# Patient Record
Sex: Male | Born: 1960 | Race: White | Hispanic: No | Smoking: Current some day smoker
Health system: Southern US, Community
[De-identification: ages and names within clinical notes are randomized; demographics above are authoritative.]

## PROBLEM LIST (undated history)

## (undated) DIAGNOSIS — E871 Hypo-osmolality and hyponatremia: Secondary | ICD-10-CM

## (undated) DIAGNOSIS — K7290 Hepatic failure, unspecified without coma: Secondary | ICD-10-CM

## (undated) DIAGNOSIS — K7460 Unspecified cirrhosis of liver: Secondary | ICD-10-CM

## (undated) DIAGNOSIS — I8500 Esophageal varices without bleeding: Secondary | ICD-10-CM

## (undated) DIAGNOSIS — R188 Other ascites: Secondary | ICD-10-CM

## (undated) DIAGNOSIS — F1020 Alcohol dependence, uncomplicated: Secondary | ICD-10-CM

## (undated) DIAGNOSIS — D649 Anemia, unspecified: Secondary | ICD-10-CM

## (undated) DIAGNOSIS — K469 Unspecified abdominal hernia without obstruction or gangrene: Secondary | ICD-10-CM

## (undated) DIAGNOSIS — K861 Other chronic pancreatitis: Secondary | ICD-10-CM

## (undated) DIAGNOSIS — K7682 Hepatic encephalopathy: Secondary | ICD-10-CM

## (undated) DIAGNOSIS — J690 Pneumonitis due to inhalation of food and vomit: Secondary | ICD-10-CM

## (undated) DIAGNOSIS — D696 Thrombocytopenia, unspecified: Principal | ICD-10-CM

## (undated) HISTORY — DX: Thrombocytopenia, unspecified: D69.6

---

## 1999-08-12 HISTORY — PX: APPENDECTOMY: SHX54

## 1999-08-12 HISTORY — PX: OTHER SURGICAL HISTORY: SHX169

## 1999-09-07 ENCOUNTER — Inpatient Hospital Stay (HOSPITAL_COMMUNITY): Admission: EM | Admit: 1999-09-07 | Discharge: 1999-09-20 | Payer: Self-pay

## 1999-09-07 ENCOUNTER — Encounter (INDEPENDENT_AMBULATORY_CARE_PROVIDER_SITE_OTHER): Payer: Self-pay | Admitting: Specialist

## 2002-10-11 ENCOUNTER — Inpatient Hospital Stay (HOSPITAL_COMMUNITY): Admission: EM | Admit: 2002-10-11 | Discharge: 2002-10-16 | Payer: Self-pay | Admitting: Emergency Medicine

## 2002-10-11 ENCOUNTER — Encounter: Payer: Self-pay | Admitting: Emergency Medicine

## 2002-10-12 ENCOUNTER — Encounter: Payer: Self-pay | Admitting: Internal Medicine

## 2005-07-28 ENCOUNTER — Emergency Department (HOSPITAL_COMMUNITY): Admission: EM | Admit: 2005-07-28 | Discharge: 2005-07-28 | Payer: Self-pay | Admitting: Emergency Medicine

## 2006-09-26 ENCOUNTER — Encounter: Admission: RE | Admit: 2006-09-26 | Discharge: 2006-09-26 | Payer: Self-pay | Admitting: Endocrinology

## 2009-12-26 ENCOUNTER — Emergency Department (HOSPITAL_BASED_OUTPATIENT_CLINIC_OR_DEPARTMENT_OTHER): Admission: EM | Admit: 2009-12-26 | Discharge: 2009-12-27 | Payer: Self-pay | Admitting: Emergency Medicine

## 2009-12-27 ENCOUNTER — Ambulatory Visit: Payer: Self-pay | Admitting: Diagnostic Radiology

## 2010-05-27 ENCOUNTER — Inpatient Hospital Stay (HOSPITAL_COMMUNITY): Admission: RE | Admit: 2010-05-27 | Discharge: 2010-06-01 | Payer: Self-pay | Admitting: Psychiatry

## 2010-05-27 ENCOUNTER — Emergency Department (HOSPITAL_COMMUNITY): Admission: EM | Admit: 2010-05-27 | Discharge: 2010-05-27 | Payer: Self-pay | Admitting: Emergency Medicine

## 2010-05-27 ENCOUNTER — Ambulatory Visit: Payer: Self-pay | Admitting: Psychiatry

## 2010-06-03 ENCOUNTER — Other Ambulatory Visit (HOSPITAL_COMMUNITY)
Admission: RE | Admit: 2010-06-03 | Discharge: 2010-07-15 | Payer: Self-pay | Source: Home / Self Care | Admitting: Psychiatry

## 2010-07-03 ENCOUNTER — Ambulatory Visit: Payer: Self-pay | Admitting: Psychiatry

## 2010-10-22 LAB — BENZODIAZEPINE, QUANTITATIVE, URINE
Alprazolam (GC/LC/MS), ur confirm: NEGATIVE NG/ML
Flurazepam GC/MS Conf: NEGATIVE NG/ML
Flurazepam GC/MS Conf: NEGATIVE NG/ML
Nordiazepam GC/MS Conf: NEGATIVE NG/ML
Temazepam GC/MS Conf: NEGATIVE NG/ML

## 2010-10-22 LAB — URINE DRUGS OF ABUSE SCREEN W ALC, ROUTINE (REF LAB)
Barbiturate Quant, Ur: NEGATIVE
Benzodiazepines.: POSITIVE — AB
Cocaine Metabolites: NEGATIVE
Creatinine,U: 90.4 mg/dL
Ethyl Alcohol: <10 mg/dL (ref <10)
Marijuana Metabolite: NEGATIVE
Marijuana Metabolite: NEGATIVE
Opiate Screen, Urine: NEGATIVE
Opiate Screen, Urine: NEGATIVE
Phencyclidine (PCP): NEGATIVE
Phencyclidine (PCP): NEGATIVE
Propoxyphene: NEGATIVE

## 2010-10-23 LAB — URINE DRUGS OF ABUSE SCREEN W ALC, ROUTINE (REF LAB)
Barbiturate Quant, Ur: NEGATIVE
Benzodiazepines.: POSITIVE — AB
Cocaine Metabolites: NEGATIVE
Ethyl Alcohol: <10 mg/dL (ref <10)
Marijuana Metabolite: NEGATIVE
Methadone: NEGATIVE
Opiate Screen, Urine: NEGATIVE
Phencyclidine (PCP): NEGATIVE

## 2010-10-23 LAB — URINALYSIS, ROUTINE W REFLEX MICROSCOPIC
Ketones, ur: NEGATIVE mg/dL
Protein, ur: NEGATIVE mg/dL
Urobilinogen, UA: 0.2 mg/dL (ref 0.0–1.0)

## 2010-10-23 LAB — BASIC METABOLIC PANEL
CO2: 27 mEq/L (ref 19–32)
Calcium: 9.2 mg/dL (ref 8.4–10.5)
Creatinine, Ser: 0.77 mg/dL (ref 0.4–1.5)
GFR calc Af Amer: >60 mL/min (ref >60)
GFR calc non Af Amer: >60 mL/min (ref >60)
Glucose, Bld: 99 mg/dL (ref 70–99)
Sodium: 139 mEq/L (ref 135–145)

## 2010-10-23 LAB — DIFFERENTIAL
Basophils Relative: 0 % (ref 0–1)
Lymphs Abs: 1.6 10*3/uL (ref 0.7–4.0)
Neutrophils Relative %: 64 % (ref 43–77)

## 2010-10-23 LAB — BENZODIAZEPINE, QUANTITATIVE, URINE
Nordiazepam GC/MS Conf: NEGATIVE NG/ML
Oxazepam GC/MS Conf: 193 NG/ML — ABNORMAL HIGH

## 2010-10-23 LAB — ETHANOL: Alcohol, Ethyl (B): 318 mg/dL — ABNORMAL HIGH (ref 0–10)

## 2010-10-23 LAB — CBC
HCT: 37.8 % — ABNORMAL LOW (ref 39.0–52.0)
MCHC: 34.8 g/dL (ref 30.0–36.0)
Platelets: 125 10*3/uL — ABNORMAL LOW (ref 150–400)

## 2010-10-23 LAB — RAPID URINE DRUG SCREEN, HOSP PERFORMED
Barbiturates: NOT DETECTED
Benzodiazepines: NOT DETECTED

## 2010-10-23 LAB — HEPATIC FUNCTION PANEL: Total Protein: 7.2 g/dL (ref 6.0–8.3)

## 2010-12-18 ENCOUNTER — Emergency Department (HOSPITAL_COMMUNITY)
Admission: EM | Admit: 2010-12-18 | Discharge: 2010-12-18 | Disposition: A | Discharge status: Home / Self Care | Payer: BC Managed Care – PPO | Attending: Emergency Medicine | Admitting: Emergency Medicine

## 2010-12-18 DIAGNOSIS — R1013 Epigastric pain: Secondary | ICD-10-CM | POA: Insufficient documentation

## 2010-12-18 DIAGNOSIS — R197 Diarrhea, unspecified: Secondary | ICD-10-CM | POA: Insufficient documentation

## 2010-12-18 DIAGNOSIS — R109 Unspecified abdominal pain: Secondary | ICD-10-CM | POA: Insufficient documentation

## 2010-12-18 DIAGNOSIS — K2920 Alcoholic gastritis without bleeding: Secondary | ICD-10-CM | POA: Insufficient documentation

## 2010-12-18 DIAGNOSIS — R112 Nausea with vomiting, unspecified: Secondary | ICD-10-CM | POA: Insufficient documentation

## 2010-12-18 DIAGNOSIS — K2270 Barrett's esophagus without dysplasia: Secondary | ICD-10-CM | POA: Insufficient documentation

## 2010-12-18 DIAGNOSIS — K219 Gastro-esophageal reflux disease without esophagitis: Secondary | ICD-10-CM | POA: Insufficient documentation

## 2010-12-18 LAB — URINALYSIS, ROUTINE W REFLEX MICROSCOPIC
Bilirubin Urine: NEGATIVE
Specific Gravity, Urine: 1.01 (ref 1.005–1.030)
Urobilinogen, UA: 0.2 mg/dL (ref 0.0–1.0)
pH: 7 (ref 5.0–8.0)

## 2010-12-27 NOTE — Discharge Summary (Signed)
NAME:  Alexander Burch, Alexander Burch NO.:  [id number]   MEDICAL RECORD NO.:  [id number]                   PATIENT TYPE:  INP   LOCATION:  0349                                 FACILITY:  [facility]   PHYSICIAN:  Brooke Bonito, M.D.                   DATE OF BIRTH:  03/02/1961   DATE OF ADMISSION:  10/11/2002  DATE OF DISCHARGE:  10/16/2002                                 DISCHARGE SUMMARY   DIAGNOSES:  1. Pancreatitis.  2. Alcohol abuse.   HOSPITAL COURSE:  The patient is a 50 year old male patient of Dr. Marylen Ponto  whom he has not seen for some time, admitted in his absence by Dr. Renne Crigler  after presenting with acute vomiting, epigastric pain, and some chemical  evidence of pancreatitis.  He was placed on pain medications and IV fluids,  and symptomatically improved.  His situation was reviewed with himself and  his mother, mainly that he should never drink any alcohol again because of  possible recurrence of his problem.  Prior to discharge, he is tolerating a  diet without any problem, he is essentially pain-free.  Plan is to be  followed up by Dr. Juleen China on an outpatient basis.    During his hospitalization, the patient was seen by Dr. Anselmo Rod,  gastroenterologist, to review the situation along with reviewing the CAT  scan noting a questionable tight gallstone.  Recommendation was to continue  treatment as outlined with no further intervention or other recommendations  made at this time.   CONDITION ON DISCHARGE:  Improved.   CONSULTATIONS:  Anselmo Rod, M.D., gastroenterology.   LABORATORY DATA:  On admission 10/11/02, CBC showed a white blood cell count  of 13,000, hemoglobin 16.1.  On 10/15/02, white blood cell count was 4300,  hemoglobin 13.4.  Electrolytes on 10/11/02, noted sodium 138, potassium 4.7,  CO2 16, BUN 20, creatinine 1.4, total protein 8.5, glucose 180.  On 10/13/02,  his glucose was 87.  Liver function tests on 10/11/02, AST was 85, ALT 77,  total bilirubin 1.9, magnesium 2, lipase 203, amylase 329.  On 10/15/02,  amylase was 240, and lipase was 137.  Electrocardiogram noted sinus  tachycardia.  Chest x-ray noted no evidence of cardiopulmonary disease.  Abdomen noted mild ileus.  CT scan of the abdomen noted enlarged echogenic  liver, suggestive of early cirrhotic changes, a single gallstone without  evidence for cholecystitis or biliary obstruction was noted, and there were  no obvious change of pancreatitis noted.  CT scan of the pelvis was  unremarkable except for prosthetic calcifications.   CONDITION ON DISCHARGE:  Improved.   FOLLOWUP:  With Dr. Sinclair Ship in one week.   DISCHARGE MEDICATIONS:  1. Multivitamins.  2. Ativan 0.5 mg t.i.d. p.r.n.  3.     Percocet b.i.d. p.r.n.  4. Ambien 10 mg p.r.n. sleep.   DISCHARGE INSTRUCTIONS:  He  needs to abstain from alcohol.                                               Brooke Bonito, M.D.    WDK/MEDQ  D:  11/03/2002  T:  11/03/2002  Job:  [id number]

## 2010-12-27 NOTE — H&P (Signed)
NAME:  Alexander Burch, Alexander Burch NO.:  [id number]   MEDICAL RECORD NO.:  [id number]                   PATIENT TYPE:  EMS   LOCATION:  ED                                   FACILITY:  [facility]   PHYSICIAN:  Soyla Murphy. Renne Crigler, M.D.               DATE OF BIRTH:  1961/06/12   DATE OF ADMISSION:  10/11/2002  DATE OF DISCHARGE:                                HISTORY & PHYSICAL   HISTORY OF PRESENT ILLNESS:  The patient is a 50 year old separated  Caucasian resident of [place] who comes in with a chief complaint of  pain.   His symptoms started about four days ago.  He had intermittent vomiting and  epigastric pain.  Two days ago it got worse and the past 24 hours it has  been persistent.  He states he vomited 25 times in a row.  His throat became  sore and abdominal pain became unbearable so he came to the emergency room.  For the past six months he had been drinking more than usual.  He has four  to five double-strength Congo Mist drinks.  His last ethanol was Sunday  night.  He tried to have something yesterday but he vomited it right back  up.  He has had no bowel movement in two days.  The vomitus has not been  bloody and the last stool had some black flecks in it, he states.  States he  had some chills and fever today.  The pain is described as involving the  entire abdomen, dull, severe, with sharp periods.  He has never had anything  like this before.  No clear exacerbating or remitting factors were noted.   PAST HISTORY:  Chest pain worked up by a [place] physician in [place]  with a stress echo two months ago which was negative by verbal report.  Past  history of complicated appendectomy.   CURRENT MEDICATIONS:  Ibuprofen 200 mg two each morning for arthritis pain.   ALLERGIES AND INTOLERANCES:  None.   PERSONAL HISTORY:  A native of [place].  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  He smokes  three cigarettes a day in the evening.  Alcohol use is as above.  He states  he does not use any other drugs and he denies any concerns about HIV virus.   FAMILY HISTORY:  He has two children alive and well; one is diabetic.  Both  parents are alive and well.   SYSTEMS REVIEW:  No recent weight change.  No change in hearing or vision.  No sinus or ear pain.  He has intermittent headaches.  History of sick  headaches in the past.  No numbness, tingling, weakness, seizures, syncope.  Throat is sore as noted above but no runny nose, no cough.  No chest pain,  shortness of  breath, or change in exercise tolerance.  He states his heart  is beating fast now.  Abdominal pain is as described below with no  constipation or diarrhea, no melena or hematochezia.  No urgency or  frequency but he states that urine burns just a little bit.  A little bit of  joint pain but no red, hot, or swollen joints.  No swelling or skin changes.   PHYSICAL EXAMINATION:  GENERAL:  He is tremulous, no acute distress at rest.  VITAL SIGNS:  Temperature 97.3, pulse 118 and 123, respirations 20, blood  pressure 148/83 and 103/68.  SKIN:  Few nevi.  LYMPH:  There is no cervical, supraclavicular, axillary, or inguinal  adenopathy.  HEENT:  Normocephalic, atraumatic.  PERRL, EOMI.  Mucous membranes are dry.  Posterior pharynx appears normal.  TMs are normal.  NECK:  Supple.  No thyromegaly, no neck masses, no carotid bruits.  PULSES:  Show 2+ and equal carotid, radial, and dorsalis pedis pulses.  LUNGS:  Clear to auscultation and percussion.  HEART:  He has tachycardia, rate about 112, with no murmurs, rubs, nor  gallops.  No JVD, no edema.  ABDOMEN:  Shows diffuse mild tenderness with moderate tenderness in the  epigastric region.  No rebound tenderness.  GENITOURINARY:  Normal external circumcised male genitalia.  RECTAL:  Normal rectal exam with stool brown and heme negative.  No masses  or prostate enlargement or  nodules.  EXTREMITIES:  Within normal limits with no cyanosis, clubbing, or edema.  NEUROLOGIC:  He is alert and oriented x3 with cranial nerves II-XII intact.  Equal strength in both lower extremities.  The ER physician said he was  hyperreflexic.  He did not relax for the exam and I was unable to test his  reflexes well.   IMPRESSION:  1. Acute pancreatitis.  N.p.o., IV fluids, pain control, check abdominal     ultrasound.  Consultation requested with Dr. Virginia Rochester.  2. Ethanol abuse.  Will need the Ativan for withdrawal symptoms - ordered.  3. Elevated glucose, probably secondary to #1.  4. History of complicated appendectomy.  5. History of chest pain two months ago with negative cardiac workup in [place].  6. History of sick headaches.  7. History of black flecks in stool with current stool heme negative.  8. History of slight dysuria.  Will check urinalysis.  9. History of anemia.  Current CBC is normal.  10.      Mildly elevated white count.  11.      Slightly elevated total bilirubin.  Abdominal ultrasound is     pending.                                               Soyla Murphy. Renne Crigler, M.D.    WDP/MEDQ  D:  10/11/2002  T:  10/12/2002  Job:  [id number]   cc:   Brooke Bonito, M.D.  [address]  [place]  [place] [address]  Fax: [phone]   Georgiana Spinner, M.D.  [address]  [place]  [place] [address]  Fax: [phone]

## 2010-12-27 NOTE — Consult Note (Signed)
NAME:  Alexander Burch, Alexander Burch                          ACCOUNT NO.:  [id number]   MEDICAL RECORD NO.:  [id number]                   PATIENT TYPE:  INP   LOCATION:  0349                                 FACILITY:  [facility]   PHYSICIAN:  Anselmo Rod, M.D.               DATE OF BIRTH:  [birth year]   DATE OF CONSULTATION:  10/13/2002  DATE OF DISCHARGE:                                   CONSULTATION   REASON FOR CONSULTATION:  Gallstone on CT scan and pancreatitis.   ASSESSMENT:  1. Acute alcoholic pancreatitis, improving slowly, lipase today is 118, down     from 218 yesterday.  2. Gallstone on CT without evidence of acute cholecystitis or ductal     dilatation.  3. History of chest pain with a recent 2-D echo and a stress test done in     [place] which the patient describes as normal (these results are not     available to me at the present time).  4. History of insomnia and depression.  5. Alcoholism.  6. Occasional gastroesophageal reflux disease.  7. History of complicated appendectomy three years ago.   RECOMMENDATIONS:  1. Hep-Lock IV, start clear liquids, and advance the patient to a low-fat     diet as tolerated.  2. Change Protonix to p.o.  3. Alcohol rehabilitation.  4. Follow LFTs closely.  5. No need for cholecystectomy at this time unless the patient becomes     symptomatic from this standpoint.   DISCUSSION:  Mr. Cohl Behrens is a very pleasant 50 year old white male who  was admitted to [facility] through the emergency room on October 11, 2002, when he presented with severe abdominal pain, nausea, and vomiting.  He was found to have an elevated lipase of 208 on admission and was treated  with IV fluids, IV pain medications, and was kept n.p.o.  His symptoms have  improved tremendously since admission, and he is requesting a meal now.  He  has mild residual periumbilical and epigastric discomfort but no nausea or  vomiting.  He denies history of melena,  hematochezia, ulcers, jaundice, or  colitis.  Appetite is good, and weight has been stable.  There is no history  of change in bowel habits, diarrhea, or constipation.  He averages one to  two bowel movements per day.  There is no history of blood transfusions or  tattoos.  He denies any genitourinary or cardiorespiratory complaints except  for history of chest pain as mentioned above.  He has a history of arthritis  with some discomfort in his hands but denies any vascular, CNS,  dermatologic, or neurologic complaints.  He has a history of depression and  anxiety along with insomnia which, he says, caused him to start drinking to  begin with in the first place.  When he was in college a couple of beers at  night helped  him sleep well, and he claims this lead to a habit over the  years.   ALLERGIES:  The patient has no known drug allergies.   MEDICATIONS:  Dilaudid, Protonix, Ativan, and Phenergan.   SOCIAL HISTORY:  The patient is divorced.  His ex-wife and his children live  in [place].  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  He smokes one-half pack per day and occasionally two to three cigars per day  and has been smoking for over 20 years.  He denies any illicit street drugs.  He used to drink five or six alcoholic beverages per day with a few beers  and three or four shots of whiskey but had increased his alcohol intake to  about 10 alcoholic beverages per day in the last eight months because of  depression.  He has been drinking for over 20 years.  He lives alone.  His  parents are very supportive and live in [place] as well.   FAMILY HISTORY:  His parents are in their late 40s and are healthy.  He has  a sister who lives in [place] who is healthy as well.  He daughter has  juvenile diabetes.  There is no known history of colon or prostate cancer.   PAST MEDICAL HISTORY:  See  list above.   REVIEW OF SYSTEMS:  Mild epigastric discharge.  No nausea or vomiting.   PHYSICAL EXAMINATION:  GENERAL:  Middle-aged white male in no acute  distress.  Very cooperative in his manner.  Lying comfortably in bed.  VITAL SIGNS:  Stable.  The patient is afebrile, blood pressure is 140/80,  temperature 98.2, pulse 76 per minute, respiratory rate 16.  HEENT:  PERRLA, EOMI, oropharyngeal mucosa without exudate.  NECK:  Supple.  No JVD, thyromegaly, or lymphadenopathy.  CHEST:  Clear to auscultation, no wheezing.  CARDIAC:  S1, S2, no  murmur, rub, or gallop.  ABDOMEN:  Soft, nondistended, with a well-healed surgical scar in the  midline below the umbilicus from previous appendectomy, some epigastric  tenderness on palpation with guarding.  No rebound or rigidity.  No  hepatosplenomegaly.  RECTAL:  Digital rectal exam was deferred.   LABORATORY DATA:  Hemoglobin 12.7 with white count of 5.6 and platelets of  93,000.  Sodium 135, potassium 3.3, chloride 104, CO2 26, BUN 11, creatinine  0.7, glucose of 87, alkaline phosphatase 49, AST 63, ALT 50, total protein  6.4, albumin 3.5, calcium 8.1.  Amylase 154, and lipase 118.   CT scan of the abdomen and pelvis done today revealed fatty liver with a  questionable tiny gallstone and no evidence of pancreatitis.  An abdominal  ultrasound done on October 12, 2002, reveals a single gallstone with no  evidence of pericholecystic fluid or gallbladder wall thickening.  There is  no evidence of biliary ductal dilatation.  The CBD measures 2.7 mm in  diameter.  No obvious signs of pancreatitis are visible.    COMMENTS:  Considering the data available, above plans have been made.  Further recommendations made as I  continue to follow the patient along with  Dr. Juleen China in the absence of Dr. Sabino Gasser.  Anselmo Rod, M.D.    JNM/MEDQ  D:  10/13/2002  T:  10/14/2002  Job:  [id number]  cc:   Brooke Bonito, M.D.  [address]  [place]  [place] [address]  Fax: [phone]   Georgiana Spinner, M.D.  [address]  [place]  [place] [address]  Fax: [phone]

## 2012-03-22 ENCOUNTER — Other Ambulatory Visit: Payer: Self-pay | Admitting: Family Medicine

## 2012-03-22 DIAGNOSIS — R160 Hepatomegaly, not elsewhere classified: Secondary | ICD-10-CM

## 2012-03-22 DIAGNOSIS — R109 Unspecified abdominal pain: Secondary | ICD-10-CM

## 2012-03-22 DIAGNOSIS — R17 Unspecified jaundice: Secondary | ICD-10-CM

## 2012-03-23 ENCOUNTER — Other Ambulatory Visit: Payer: BC Managed Care – PPO

## 2012-03-24 ENCOUNTER — Ambulatory Visit
Admission: RE | Admit: 2012-03-24 | Discharge: 2012-03-24 | Disposition: A | Discharge status: Home / Self Care | Payer: BC Managed Care – PPO | Source: Ambulatory Visit | Attending: Family Medicine | Admitting: Family Medicine

## 2012-03-24 DIAGNOSIS — R17 Unspecified jaundice: Secondary | ICD-10-CM

## 2012-03-24 DIAGNOSIS — R109 Unspecified abdominal pain: Secondary | ICD-10-CM

## 2012-03-24 DIAGNOSIS — R160 Hepatomegaly, not elsewhere classified: Secondary | ICD-10-CM

## 2012-03-24 MED ORDER — IOHEXOL 300 MG/ML  SOLN
100.0000 mL | Freq: Once | INTRAMUSCULAR | Status: AC | PRN
  Administered 2012-03-24: 100 mL via INTRAVENOUS

## 2013-03-01 ENCOUNTER — Telehealth: Payer: Self-pay | Admitting: Hematology & Oncology

## 2013-03-01 NOTE — Telephone Encounter (Signed)
Pt aware of 8-18 appointment

## 2013-03-28 ENCOUNTER — Ambulatory Visit: Payer: BC Managed Care – PPO

## 2013-03-28 ENCOUNTER — Other Ambulatory Visit (HOSPITAL_BASED_OUTPATIENT_CLINIC_OR_DEPARTMENT_OTHER): Payer: BC Managed Care – PPO | Admitting: Lab

## 2013-03-28 ENCOUNTER — Ambulatory Visit (HOSPITAL_BASED_OUTPATIENT_CLINIC_OR_DEPARTMENT_OTHER): Payer: BC Managed Care – PPO | Admitting: Hematology & Oncology

## 2013-03-28 VITALS — BP 119/72 | HR 78 | Temp 98.2°F | Resp 18 | Ht 72.0 in | Wt 160.0 lb

## 2013-03-28 DIAGNOSIS — R160 Hepatomegaly, not elsewhere classified: Secondary | ICD-10-CM

## 2013-03-28 DIAGNOSIS — R161 Splenomegaly, not elsewhere classified: Secondary | ICD-10-CM

## 2013-03-28 DIAGNOSIS — D696 Thrombocytopenia, unspecified: Secondary | ICD-10-CM

## 2013-03-28 DIAGNOSIS — F1010 Alcohol abuse, uncomplicated: Secondary | ICD-10-CM

## 2013-03-28 LAB — CBC WITH DIFFERENTIAL (CANCER CENTER ONLY)
EOS%: 4.1 % (ref 0.0–7.0)
Eosinophils Absolute: 0.2 10*3/uL (ref 0.0–0.5)
HCT: 37.8 % — ABNORMAL LOW (ref 38.7–49.9)
LYMPH#: 0.8 10*3/uL — ABNORMAL LOW (ref 0.9–3.3)
MCH: 34 pg — ABNORMAL HIGH (ref 28.0–33.4)
MCHC: 33.9 g/dL (ref 32.0–35.9)
MCV: 101 fL — ABNORMAL HIGH (ref 82–98)
MONO%: 10.4 % (ref 0.0–13.0)
NEUT#: 3.8 10*3/uL (ref 1.5–6.5)
Platelets: 38 10*3/uL — ABNORMAL LOW (ref 145–400)
RDW: 13.4 % (ref 11.1–15.7)

## 2013-03-28 LAB — TECHNOLOGIST REVIEW CHCC SATELLITE

## 2013-03-28 NOTE — Progress Notes (Signed)
This office note has been dictated.

## 2013-03-29 NOTE — Progress Notes (Signed)
CC:   Warrick Parisian, MD Anselmo Rod, MD, [facility]  DIAGNOSIS:  Progressive thrombocytopenia.  HISTORY OF PRESENT ILLNESS:  Mr. Martucci is a very nice 52 year old white gentleman.  He is incredibly honest about his social issues.  He drinks quite a bit.  He is an alcoholic that he admits.  He used to drink quite heavily with hard liquor.  He now drinks about 6 beers a day.  He has had pancreatitis.  I think the last bout was about a year ago.  He was, I think, working in [employer].  I think he is trying to get on disability because of chronic health issues.  He is seen by Dr. Loreta Ave for GI issues.  He might be developing cirrhosis from his alcohol use.  He sees Dr. Creta Levin over at [facility] in [place]. He had lab work done back in June which showed a white cell count 6.1, hemoglobin 13, hematocrit 38, platelet count 76,000.  MCV was 97.  His electrolytes looked okay.  He had normal TSH.  A repeat set of labs was done on 07/14.  This showed a white cell count 6.7, hemoglobin 12.6, hematocrit 39, platelet count 74,000.  MCV is 99.  Based on this lab work, Dr. Creta Levin felt that he needed to be seen by Hematology.  As such, he was referred to the [facility].  Again, he has had pancreatitis in the past from drinking.  He says his issues began back in like 2001 when he had an appendectomy that apparently did not go too well.  He has had no bleeding.  He has had some bruising.  He has had weight loss.  He has probably lost about 10 pound over the past couple months. There has been no cough.  He has had no leg swelling.  He has had some slight abdominal swelling.  He has an umbilical hernia.  Overall, his performance status is ECOG 1.  PAST MEDICAL HISTORY: 1. Alcoholic pancreatitis. 2. Depression. 3. Reflux. 4. Umbilical hernia.  ALLERGIES:  None.  MEDICATIONS:  Pancrease 20,000 units daily, AcipHex 20 mg p.o.  daily, Zoloft 100 mg p.o. daily, Phenergan 25 mg p.o. q.8 hours p.r.n.  SOCIAL HISTORY:  Remarkable for heavy alcohol use.  He currently smokes about a pack per day.  He has no recreational drug use.  FAMILY HISTORY:  Noncontributory.  REVIEW OF SYSTEMS:  As stated in history of present illness.  PHYSICAL EXAMINATION:  General:  This is a slightly ill-appearing white gentleman in no obvious distress.  Vital signs:  Temperature of 98.2, pulse 78, respiratory rate 18, blood pressure 119/72.  Weight is 160. Head and neck:  Normocephalic, atraumatic skull.  There are no ocular or oral lesions.  There is no scleral icterus.  He has no adenopathy in the neck.  Lungs:  Clear bilaterally.  Cardiac:  Regular rate and rhythm with a normal S1, S2.  There are no murmurs, rubs or bruits.  Abdomen: Slightly distended.  It is hard to say if he has a fluid wave.  There is an umbilical hernia.  His liver edge is about 5 cm below the right costal margin.  His spleen tip is palpable at the left costal margin. There may be some abdominal varices.  Extremities:  Show some trace edema in his legs.  Neurological:  Shows no focal neurological deficits.  LABORATORY STUDIES:  White cell count is 5.4, hemoglobin 12.8, hematocrit 37.8, platelet count is 38,000.  MCV is 101.  Peripheral smear shows a normochromic, normocytic population of red blood cells.  I see some echinocytes.  There are no target cells.  I see no inclusion bodies.  There is no polychromasia.  There is no rouleaux formation.  White cells appear normal in morphology and maturation.  He may have a couple of hypersegmented polys.  I see no blasts.  Platelets are moderately decreased in number.  His platelets are small.  There may be a rare large platelet.  IMPRESSION:  Mr. Nehme is a nice 52 year old white gentleman.  Again, he is incredibly forthcoming with his issues. One has to believe that he has a combination of factors that are  causing the thrombocytopenia.  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  The fact that he has small platelets on the blood smear would indicate that his marrow is not producing platelet precursors.  However, he does have hepatomegaly and splenomegaly.  As such, he may have some sequestration into these organs.  I do not think he has hepatitis or HIV.  This is always a possibility.  I do not think he has vitamin B12 deficiency.  I do not think he has folic acid deficiency.  I did tell him to take multivitamins with extra folic acid and thiamine.  It is certainly possible that Mr. Loveday may need a bone marrow biopsy in the future.  I think as long as he is drinking, his platelet count will never improve.  In fact, it may worsen.  He realizes the danger that he is doing to himself.  He understands the consequences of his alcohol use incredibly well.  I would like to Mr. Athey back in another month.  I have reviewed his lab work with him.  He is a very interesting guy. Not many people [place] in [place] know [place], this is down in [place].    ______________________________ Josph Macho, M.D. PRE/MEDQ  D:  03/28/2013  T:  03/29/2013  Job:  [id number]

## 2013-04-25 ENCOUNTER — Encounter: Payer: Self-pay | Admitting: Hematology & Oncology

## 2013-04-25 ENCOUNTER — Other Ambulatory Visit (HOSPITAL_BASED_OUTPATIENT_CLINIC_OR_DEPARTMENT_OTHER): Payer: BC Managed Care – PPO | Admitting: Lab

## 2013-04-25 ENCOUNTER — Ambulatory Visit (HOSPITAL_BASED_OUTPATIENT_CLINIC_OR_DEPARTMENT_OTHER): Payer: BC Managed Care – PPO | Admitting: Hematology & Oncology

## 2013-04-25 VITALS — BP 126/73 | HR 88 | Temp 98.4°F | Resp 18 | Ht 72.0 in | Wt 163.0 lb

## 2013-04-25 DIAGNOSIS — D696 Thrombocytopenia, unspecified: Secondary | ICD-10-CM

## 2013-04-25 DIAGNOSIS — K7030 Alcoholic cirrhosis of liver without ascites: Secondary | ICD-10-CM

## 2013-04-25 HISTORY — DX: Thrombocytopenia, unspecified: D69.6

## 2013-04-25 LAB — CBC WITH DIFFERENTIAL (CANCER CENTER ONLY)
Eosinophils Absolute: 0.2 10*3/uL (ref 0.0–0.5)
HCT: 37.3 % — ABNORMAL LOW (ref 38.7–49.9)
LYMPH%: 10.3 % — ABNORMAL LOW (ref 14.0–48.0)
MCV: 103 fL — ABNORMAL HIGH (ref 82–98)
MONO#: 0.9 10*3/uL (ref 0.1–0.9)
NEUT%: 76.5 % (ref 40.0–80.0)
RBC: 3.62 10*6/uL — ABNORMAL LOW (ref 4.20–5.70)
WBC: 8.5 10*3/uL (ref 4.0–10.0)

## 2013-04-25 NOTE — Progress Notes (Signed)
This office note has been dictated.

## 2013-04-27 LAB — HEPATITIS PANEL, ACUTE
HCV Ab: NEGATIVE
Hep A IgM: NEGATIVE

## 2013-04-27 LAB — VITAMIN B12: Vitamin B-12: 1691 pg/mL — ABNORMAL HIGH (ref 211–911)

## 2013-04-27 LAB — FOLATE RBC: RBC Folate: 901 ng/mL (ref >=366)

## 2013-05-10 NOTE — Progress Notes (Signed)
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  CURRENT THERAPY:  Observation.  INTERIM HISTORY:  Alexander Burch comes in for his follow up.  We saw him back in August.  At that point in time, we went ahead and did some routine lab work on him.  All of our tests came back pretty much unremarkable. His blood smear certainly did not show anything that looks suspicious for any hematologic malignancy.  He now has more abdominal distention.  I think he may actually have ascites.  I told him that Dr.  Loreta Burch probably would need to see him for this.  He has had no bleeding.  There has been some bruising.  He says that he is trying to cut back on alcohol use.  He has had no cough.  He has had no leg swelling.  He has had no fever, sweats or chills.  There has been no melena or bright-red blood per rectum.  PHYSICAL EXAMINATION:  General:  This is a thin white gentleman in no obvious distress.  Vital Signs:  Temperature of 98.4, pulse 88, respiratory 18, blood pressure 126/73.  Weight is 163.  Head/Neck: Normocephalic, atraumatic skull.  There are no ocular or oral lesions. He has no scleral icterus.  There is no intraoral petechia.  Neck: Supple with no adenopathy.  Thyroid is nonpalpable.  Lungs:  Clear bilaterally.  There may be some slight decrease at the bases.  Cardiac: Regular rate and rhythm with a normal S1 and S2.  He has no murmurs, rubs or bruits.  Abdomen:  Somewhat distended.  There is a fluid wave. He has an umbilical hernia that reduces.  There is no guarding or rebound tenderness.  He does have some variceal veins on his anterior abdominal wall.  There is no palpable hepatomegaly.  His spleen tip may be palpable with deep inspiration.  Extremities:  Shows muscle atrophy in the upper and lower extremities.  He has decent range of motion of his joints.  He has good strength in his  upper and lower extremities. Skin:  Exam does show some scattered ecchymoses.  Neurological:  Shows no focal neurological deficits.  LABORATORY STUDIES:  Show a white cell count of 8.5, hemoglobin 12.5, hematocrit 37.3, platelet count 68,000.  MCV is 100.  Peripheral smear, which I reviewed, shows normochromic, normocytic population of red blood cells.  There may be some spherocytes.  I saw no target cells.  There is no rouleaux formation.  I do not see any schistocytes.  White cells appear normal in morphology and maturation. There are no hypersegmented polys.  He had no immature myeloid or lymphoid forms.  Platelets were decreased in number.  He had mostly smaller platelets.  Platelets were well granulated.  IMPRESSION:  Alexander Burch is a 52 year old gentleman with thrombocytopenia. He has heavy, heavy alcohol use.  Again, I still suspect that he likely has multifactorial reasons for the thrombocytopenia.  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  It looks like he may have ascites now.  I suspect that he also may have some marrow toxicity from alcohol use. The fact that the platelets appeared small in size certainly would indicate decreased marrow production.  When I first saw him, I told him to take folic acid and thiamine.  I think he is doing this.  I do not believe this is hepatitis B/C or HIV.  He  does not have these diseases.  He is asymptomatic.  We will go ahead and plan to get him back in 2 months' time now.  I still believe that the less he drinks, the better his platelets will be and the higher that they will stabilize.  I suspect that he always will have thrombocytopenia.  I spent a good 25 minutes with him today.    ______________________________ Josph Macho, M.D. PRE/MEDQ  D:  04/25/2013  T:  05/10/2013  Job:  [id number]

## 2013-06-20 ENCOUNTER — Telehealth: Payer: Self-pay | Admitting: Hematology & Oncology

## 2013-06-20 ENCOUNTER — Ambulatory Visit: Payer: BC Managed Care – PPO | Admitting: Hematology & Oncology

## 2013-06-20 ENCOUNTER — Other Ambulatory Visit: Payer: BC Managed Care – PPO | Admitting: Lab

## 2013-06-20 NOTE — Telephone Encounter (Signed)
Pt called cx 11-10 rescheduled for 12-11 he has the flu. He left RN message about blood work

## 2013-06-28 ENCOUNTER — Inpatient Hospital Stay (HOSPITAL_COMMUNITY)
Admission: EM | Admit: 2013-06-28 | Discharge: 2013-07-01 | DRG: 947 | Disposition: A | Discharge status: Home / Self Care | Payer: BC Managed Care – PPO | Attending: Internal Medicine | Admitting: Internal Medicine

## 2013-06-28 ENCOUNTER — Emergency Department (HOSPITAL_COMMUNITY): Payer: BC Managed Care – PPO

## 2013-06-28 ENCOUNTER — Encounter (HOSPITAL_COMMUNITY): Payer: Self-pay | Admitting: Emergency Medicine

## 2013-06-28 DIAGNOSIS — K769 Liver disease, unspecified: Secondary | ICD-10-CM

## 2013-06-28 DIAGNOSIS — K861 Other chronic pancreatitis: Secondary | ICD-10-CM | POA: Diagnosis present

## 2013-06-28 DIAGNOSIS — J189 Pneumonia, unspecified organism: Secondary | ICD-10-CM

## 2013-06-28 DIAGNOSIS — D696 Thrombocytopenia, unspecified: Secondary | ICD-10-CM | POA: Diagnosis present

## 2013-06-28 DIAGNOSIS — K5900 Constipation, unspecified: Secondary | ICD-10-CM | POA: Diagnosis not present

## 2013-06-28 DIAGNOSIS — K7682 Hepatic encephalopathy: Secondary | ICD-10-CM | POA: Diagnosis present

## 2013-06-28 DIAGNOSIS — S2231XA Fracture of one rib, right side, initial encounter for closed fracture: Secondary | ICD-10-CM

## 2013-06-28 DIAGNOSIS — F17200 Nicotine dependence, unspecified, uncomplicated: Secondary | ICD-10-CM | POA: Diagnosis present

## 2013-06-28 DIAGNOSIS — J690 Pneumonitis due to inhalation of food and vomit: Secondary | ICD-10-CM | POA: Diagnosis present

## 2013-06-28 DIAGNOSIS — R188 Other ascites: Principal | ICD-10-CM | POA: Diagnosis present

## 2013-06-28 DIAGNOSIS — F1020 Alcohol dependence, uncomplicated: Secondary | ICD-10-CM | POA: Diagnosis present

## 2013-06-28 DIAGNOSIS — S2239XA Fracture of one rib, unspecified side, initial encounter for closed fracture: Secondary | ICD-10-CM

## 2013-06-28 DIAGNOSIS — T502X5A Adverse effect of carbonic-anhydrase inhibitors, benzothiadiazides and other diuretics, initial encounter: Secondary | ICD-10-CM | POA: Diagnosis not present

## 2013-06-28 DIAGNOSIS — E871 Hypo-osmolality and hyponatremia: Secondary | ICD-10-CM | POA: Diagnosis present

## 2013-06-28 DIAGNOSIS — Z79899 Other long term (current) drug therapy: Secondary | ICD-10-CM

## 2013-06-28 DIAGNOSIS — I8500 Esophageal varices without bleeding: Secondary | ICD-10-CM

## 2013-06-28 DIAGNOSIS — D539 Nutritional anemia, unspecified: Secondary | ICD-10-CM | POA: Diagnosis present

## 2013-06-28 DIAGNOSIS — K7290 Hepatic failure, unspecified without coma: Secondary | ICD-10-CM

## 2013-06-28 DIAGNOSIS — I8510 Secondary esophageal varices without bleeding: Secondary | ICD-10-CM | POA: Diagnosis present

## 2013-06-28 DIAGNOSIS — K219 Gastro-esophageal reflux disease without esophagitis: Secondary | ICD-10-CM | POA: Diagnosis present

## 2013-06-28 DIAGNOSIS — E876 Hypokalemia: Secondary | ICD-10-CM

## 2013-06-28 DIAGNOSIS — D649 Anemia, unspecified: Secondary | ICD-10-CM

## 2013-06-28 DIAGNOSIS — F1010 Alcohol abuse, uncomplicated: Secondary | ICD-10-CM

## 2013-06-28 DIAGNOSIS — K7030 Alcoholic cirrhosis of liver without ascites: Secondary | ICD-10-CM | POA: Diagnosis present

## 2013-06-28 DIAGNOSIS — J984 Other disorders of lung: Secondary | ICD-10-CM | POA: Diagnosis present

## 2013-06-28 HISTORY — DX: Alcohol dependence, uncomplicated: F10.20

## 2013-06-28 HISTORY — DX: Unspecified abdominal hernia without obstruction or gangrene: K46.9

## 2013-06-28 HISTORY — DX: Unspecified cirrhosis of liver: K74.60

## 2013-06-28 LAB — URINALYSIS, ROUTINE W REFLEX MICROSCOPIC
Glucose, UA: NEGATIVE mg/dL
Hgb urine dipstick: NEGATIVE
Ketones, ur: NEGATIVE mg/dL
Protein, ur: NEGATIVE mg/dL
Urobilinogen, UA: 1 mg/dL (ref 0.0–1.0)

## 2013-06-28 LAB — CBC WITH DIFFERENTIAL/PLATELET
Basophils Relative: 1 % (ref 0–1)
Eosinophils Relative: 2 % (ref 0–5)
Lymphocytes Relative: 11 % — ABNORMAL LOW (ref 12–46)
Lymphs Abs: 0.9 10*3/uL (ref 0.7–4.0)
MCHC: 34.1 g/dL (ref 30.0–36.0)
MCV: 100 fL (ref 78.0–100.0)
Neutrophils Relative %: 75 % (ref 43–77)
Platelets: 128 10*3/uL — ABNORMAL LOW (ref 150–400)
RBC: 3.67 MIL/uL — ABNORMAL LOW (ref 4.22–5.81)
RDW: 13.9 % (ref 11.5–15.5)
WBC: 8.7 10*3/uL (ref 4.0–10.5)

## 2013-06-28 LAB — COMPREHENSIVE METABOLIC PANEL
ALT: 26 U/L (ref 0–53)
Alkaline Phosphatase: 307 U/L — ABNORMAL HIGH (ref 39–117)
CO2: 23 mEq/L (ref 19–32)
Chloride: 94 mEq/L — ABNORMAL LOW (ref 96–112)
GFR calc Af Amer: >90 mL/min (ref >90)
GFR calc non Af Amer: >90 mL/min (ref >90)
Glucose, Bld: 101 mg/dL — ABNORMAL HIGH (ref 70–99)
Potassium: 3.8 mEq/L (ref 3.5–5.1)
Sodium: 128 mEq/L — ABNORMAL LOW (ref 135–145)
Total Bilirubin: 3.6 mg/dL — ABNORMAL HIGH (ref 0.3–1.2)

## 2013-06-28 LAB — URINE MICROSCOPIC-ADD ON

## 2013-06-28 MED ORDER — THIAMINE HCL 100 MG/ML IJ SOLN
100.0000 mg | Freq: Every day | INTRAMUSCULAR | Status: DC
  Filled 2013-06-28 (×3): qty 1

## 2013-06-28 MED ORDER — ONDANSETRON HCL 4 MG/2ML IJ SOLN: 4.0000 mg | Freq: Four times a day (QID) | INTRAMUSCULAR | Status: DC | PRN

## 2013-06-28 MED ORDER — ADULT MULTIVITAMIN W/MINERALS CH
1.0000 | ORAL_TABLET | Freq: Every day | ORAL | Status: DC
  Administered 2013-06-28 – 2013-07-01 (×4): 1 via ORAL
  Filled 2013-06-28 (×4): qty 1

## 2013-06-28 MED ORDER — LEVOFLOXACIN IN D5W 750 MG/150ML IV SOLN: 750.0000 mg | Freq: Once | INTRAVENOUS | Status: DC

## 2013-06-28 MED ORDER — LORAZEPAM 1 MG PO TABS
1.0000 mg | ORAL_TABLET | Freq: Four times a day (QID) | ORAL | Status: DC | PRN
  Administered 2013-06-28 – 2013-07-01 (×5): 1 mg via ORAL
  Filled 2013-06-28 (×5): qty 1

## 2013-06-28 MED ORDER — PROMETHAZINE HCL 25 MG PO TABS: 25.0000 mg | ORAL_TABLET | Freq: Three times a day (TID) | ORAL | Status: DC | PRN

## 2013-06-28 MED ORDER — SERTRALINE HCL 100 MG PO TABS
100.0000 mg | ORAL_TABLET | Freq: Every day | ORAL | Status: DC
  Administered 2013-06-28 – 2013-07-01 (×4): 100 mg via ORAL
  Filled 2013-06-28: qty 2
  Filled 2013-06-28 (×3): qty 1

## 2013-06-28 MED ORDER — MORPHINE SULFATE 2 MG/ML IJ SOLN
2.0000 mg | INTRAMUSCULAR | Status: DC | PRN
  Administered 2013-06-28 – 2013-06-30 (×6): 2 mg via INTRAVENOUS
  Filled 2013-06-28 (×6): qty 1

## 2013-06-28 MED ORDER — PIPERACILLIN-TAZOBACTAM 3.375 G IVPB 30 MIN
3.3750 g | Freq: Once | INTRAVENOUS | Status: AC
  Administered 2013-06-28: 3.375 g via INTRAVENOUS
  Filled 2013-06-28: qty 50

## 2013-06-28 MED ORDER — VITAMIN B-1 100 MG PO TABS
100.0000 mg | ORAL_TABLET | Freq: Every day | ORAL | Status: DC
  Administered 2013-06-28 – 2013-07-01 (×4): 100 mg via ORAL
  Filled 2013-06-28 (×4): qty 1

## 2013-06-28 MED ORDER — ONE-DAILY MULTI VITAMINS PO TABS: 1.0000 | ORAL_TABLET | Freq: Every day | ORAL | Status: DC

## 2013-06-28 MED ORDER — VANCOMYCIN HCL IN DEXTROSE 1-5 GM/200ML-% IV SOLN
1000.0000 mg | Freq: Once | INTRAVENOUS | Status: AC
  Administered 2013-06-28: 19:00:00 1000 mg via INTRAVENOUS
  Filled 2013-06-28: qty 200

## 2013-06-28 MED ORDER — IOHEXOL 300 MG/ML  SOLN
100.0000 mL | Freq: Once | INTRAMUSCULAR | Status: AC | PRN
  Administered 2013-06-28: 100 mL via INTRAVENOUS

## 2013-06-28 MED ORDER — IOHEXOL 300 MG/ML  SOLN
50.0000 mL | Freq: Once | INTRAMUSCULAR | Status: AC | PRN
  Administered 2013-06-28: 50 mL via ORAL

## 2013-06-28 MED ORDER — ONDANSETRON HCL 4 MG PO TABS: 4.0000 mg | ORAL_TABLET | Freq: Four times a day (QID) | ORAL | Status: DC | PRN

## 2013-06-28 MED ORDER — PIPERACILLIN-TAZOBACTAM 3.375 G IVPB: 3.3750 g | Freq: Once | INTRAVENOUS | Status: DC

## 2013-06-28 MED ORDER — VANCOMYCIN HCL IN DEXTROSE 1-5 GM/200ML-% IV SOLN
1000.0000 mg | Freq: Three times a day (TID) | INTRAVENOUS | Status: DC
  Administered 2013-06-29: 1000 mg via INTRAVENOUS
  Filled 2013-06-28 (×3): qty 200

## 2013-06-28 MED ORDER — HYDROMORPHONE HCL PF 1 MG/ML IJ SOLN
0.5000 mg | Freq: Once | INTRAMUSCULAR | Status: AC
  Administered 2013-06-28: 0.5 mg via INTRAVENOUS
  Filled 2013-06-28: qty 1

## 2013-06-28 MED ORDER — FUROSEMIDE 10 MG/ML IJ SOLN
40.0000 mg | Freq: Every day | INTRAMUSCULAR | Status: DC
  Administered 2013-06-28 – 2013-06-30 (×3): 40 mg via INTRAVENOUS
  Filled 2013-06-28 (×3): qty 4

## 2013-06-28 MED ORDER — DEXTROSE 5 % IV SOLN: 1.0000 g | Freq: Two times a day (BID) | INTRAVENOUS | Status: DC

## 2013-06-28 MED ORDER — PANCRELIPASE (LIP-PROT-AMYL) 12000-38000 UNITS PO CPEP
1.0000 | ORAL_CAPSULE | Freq: Every day | ORAL | Status: DC
  Administered 2013-06-28 – 2013-06-30 (×3): 1 via ORAL
  Filled 2013-06-28 (×4): qty 1

## 2013-06-28 MED ORDER — PANCRELIPASE (LIP-PROT-AMYL) 20000-68000 UNITS PO CPEP: 1.0000 | ORAL_CAPSULE | Freq: Every morning | ORAL | Status: DC

## 2013-06-28 MED ORDER — PIPERACILLIN-TAZOBACTAM 3.375 G IVPB
3.3750 g | Freq: Three times a day (TID) | INTRAVENOUS | Status: DC
  Administered 2013-06-29 – 2013-07-01 (×7): 3.375 g via INTRAVENOUS
  Filled 2013-06-28 (×10): qty 50

## 2013-06-28 MED ORDER — ADULT MULTIVITAMIN W/MINERALS CH
1.0000 | ORAL_TABLET | Freq: Every day | ORAL | Status: DC
Start: 2013-06-28 — End: 2013-06-28

## 2013-06-28 MED ORDER — FOLIC ACID 1 MG PO TABS
1.0000 mg | ORAL_TABLET | Freq: Every day | ORAL | Status: DC
  Administered 2013-06-28 – 2013-07-01 (×4): 1 mg via ORAL
  Filled 2013-06-28 (×4): qty 1

## 2013-06-28 MED ORDER — PANTOPRAZOLE SODIUM 40 MG PO TBEC
40.0000 mg | DELAYED_RELEASE_TABLET | Freq: Every day | ORAL | Status: DC
  Administered 2013-06-28 – 2013-07-01 (×4): 40 mg via ORAL
  Filled 2013-06-28 (×4): qty 1

## 2013-06-28 MED ORDER — LORAZEPAM 2 MG/ML IJ SOLN
1.0000 mg | Freq: Four times a day (QID) | INTRAMUSCULAR | Status: DC | PRN
  Administered 2013-06-29 (×2): 1 mg via INTRAVENOUS
  Filled 2013-06-28 (×2): qty 1

## 2013-06-28 MED ORDER — DEXTROSE 5 % IV SOLN: 500.0000 mg | Freq: Once | INTRAVENOUS | Status: DC

## 2013-06-28 MED ORDER — SODIUM CHLORIDE 0.9 % IJ SOLN
3.0000 mL | Freq: Two times a day (BID) | INTRAMUSCULAR | Status: DC
  Administered 2013-06-28 – 2013-06-30 (×3): 3 mL via INTRAVENOUS

## 2013-06-28 NOTE — ED Provider Notes (Signed)
CSN: [id number]     Arrival date & time 06/28/13  1113 History   First MD Initiated Contact with Patient 06/28/13 1206     Chief Complaint  Patient presents with  . Abdominal Pain  . Ascites   (Consider location/radiation/quality/duration/timing/severity/associated sxs/prior Treatment) HPI Comments: The patient is a 52 year-old male with a past medical history of cirrhosis, alcohol abuse, pancreatitis, and GERD presenting the Emergency Department with a chief complaint of right flank pain since last night.  He reports an abrupt onset of Right flank/rib pain without radiation.  He denies trauma or fall to the area. He reports the pain is aggravated by deep inspirations and pressure. He reports a cough and a fever of 100 yesterday.  He also reports an increase in shortness of breath with exertion. He denies fever or chills. He also complains of an increase in abdominal distention for greater than three weeks.  He is followed by Dr. Loreta Ave, who placed him on Furosamide 20 mg BID, and Spironolactone 50 mg BID for treatment of his ascites.  He reports compliance with the diuretic treatment.  The patient attributes dyspnea is due to increasing ascites.  He denies lower extremity edema or scrotal edema.  He denies chest pain or palpitations. He reports nausea without vomiting. He denies diarrhea or constipation.       Patient is a 52 y.o. male presenting with abdominal pain. The history is provided by the patient. No language interpreter was used.  Abdominal Pain   Past Medical History  Diagnosis Date  . Thrombocytopenia, unspecified 04/25/2013  . Alcoholism   . Liver cirrhosis   . Pancreatitis   . Hernia    Past Surgical History  Procedure Laterality Date  . Appendectomy  2001  . Small intestine abcess  2001   History reviewed. No pertinent family history. History  Substance Use Topics  . Smoking status: Current Some Day Smoker    Types: Cigarettes  . Smokeless tobacco: Never Used  .  Alcohol Use: Yes     Comment: 4-5 drinks a day    Review of Systems  Gastrointestinal: Positive for abdominal pain.  All other systems reviewed and are negative.    Allergies  Review of patient's allergies indicates no known allergies.  Home Medications   Current Outpatient Rx  Name  Route  Sig  Dispense  Refill  . furosemide (LASIX) 20 MG tablet   Oral   Take 20 mg by mouth 2 (two) times daily.         . Multiple Vitamin (MULTIVITAMIN) tablet   Oral   Take 1 tablet by mouth daily.         . Pancrelipase, Lip-Prot-Amyl, (ZENPEP) 20000 UNITS CPEP   Oral   Take 1 capsule by mouth every morning.          . promethazine (PHENERGAN) 25 MG tablet   Oral   Take 25 mg by mouth every 8 (eight) hours as needed for nausea.         . RABEprazole (ACIPHEX) 20 MG tablet   Oral   Take 20 mg by mouth daily.         . sertraline (ZOLOFT) 100 MG tablet   Oral   Take 100 mg by mouth daily.         . Simethicone (GAS-X EXTRA STRENGTH PO)   Oral   Take 1 tablet by mouth daily as needed (gas).         Marland Kitchen spironolactone (ALDACTONE) 50  MG tablet   Oral   Take 50 mg by mouth 2 (two) times daily.          BP 131/76  Pulse 79  Temp(Src) 97.8 F (36.6 C) (Oral)  Resp 17  Ht 6' (1.829 m)  Wt 160 lb (72.576 kg)  BMI 21.70 kg/m2  SpO2 98% Physical Exam  Nursing note and vitals reviewed. Constitutional: He appears well-developed. No distress.  HENT:  Head: Normocephalic and atraumatic.  Mouth/Throat: No oropharyngeal exudate.  Eyes: Scleral icterus is present.  Neck: Normal range of motion. Neck supple.  Cardiovascular: Normal rate and regular rhythm.   No murmur heard. No lower extremity edema.  Pulmonary/Chest: He has decreased breath sounds in the right lower field. He has rhonchi. He exhibits tenderness and bony tenderness. He exhibits no crepitus.  Left lower flank and ribs tender to palpation. No errythema or ecchymosis.  Crepitus noted with second  evaluation of the patient with deep inspiration and trunk movement. Patient is able to speak in short sentences. SpO2 >95% RA.  Abdominal: Bowel sounds are normal. He exhibits distension and ascites. There is no rebound and no guarding. A hernia is present.  Large distended  Abdomin.  Umbilical hernia reducible with palpation.     Musculoskeletal: He exhibits no edema.  Neurological: He is alert.  Skin: Skin is warm and dry. He is not diaphoretic.  Chest with multiple spider angioma.  Spider angioma to back and abdomen     ED Course  Procedures (including critical care time) Labs Review Labs Reviewed  CBC WITH DIFFERENTIAL - Abnormal; Notable for the following:    RBC 3.67 (*)    Hemoglobin 12.5 (*)    HCT 36.7 (*)    MCH 34.1 (*)    Platelets 128 (*)    Lymphocytes Relative 11 (*)    All other components within normal limits  COMPREHENSIVE METABOLIC PANEL - Abnormal; Notable for the following:    Sodium 128 (*)    Chloride 94 (*)    Glucose, Bld 101 (*)    BUN 5 (*)    Albumin 2.7 (*)    AST 79 (*)    Alkaline Phosphatase 307 (*)    Total Bilirubin 3.6 (*)    All other components within normal limits  PROTIME-INR - Abnormal; Notable for the following:    Prothrombin Time 17.6 (*)    All other components within normal limits  URINALYSIS, ROUTINE W REFLEX MICROSCOPIC - Abnormal; Notable for the following:    Color, Urine ORANGE (*)    Bilirubin Urine MODERATE (*)    Leukocytes, UA SMALL (*)    All other components within normal limits  URINE MICROSCOPIC-ADD ON - Abnormal; Notable for the following:    Casts HYALINE CASTS (*)    All other components within normal limits   Imaging Review Dg Chest 2 View  06/28/2013   CLINICAL DATA:  Right side lower posterior chest and back pain, smoker, ascites, cirrhosis  EXAM: CHEST  2 VIEW  COMPARISON:  None  FINDINGS: Normal heart size.  Abnormal soft tissue density at right hilum on frontal view corresponds to density posteriorly  on the lateral view likely a retro hilar mass in the superior segment of the right lower lobe.  Remaining lungs clear.  No pleural effusion or pneumothorax.  Mild elevation of right diaphragm noted.  Fractures seen at lateral right 8th rib and posterolateral right 10th rib.  IMPRESSION: Suspected pulmonary mass in the superior segment of the  right lower lobe; CT chest with contrast recommended for further evaluation.  Fractures of the right 8th and 10th ribs as above.  Findings called to Mellody Drown on 06/28/2013 at 1350 hr.   Electronically Signed   By: Ulyses Southward M.D.   On: 06/28/2013 13:50   Ct Chest W Contrast  06/28/2013   ADDENDUM REPORT: 06/28/2013 16:26  ADDENDUM: Minimally displaced fracture of of the right 8th and 10th rib.   Electronically Signed   By: Bridgett Larsson M.D.   On: 06/28/2013 16:26   06/28/2013   CLINICAL DATA:  Shortness of breath. Abdominal pain. Distention. Ascites. History of appendectomy.  EXAM: CT CHEST, ABDOMEN, AND PELVIS  WITH CONTRAST  TECHNIQUE: Multidetector CT imaging of the chest, abdomen and pelvis was performed following the standard protocol during bolus administration of intravenous contrast.  CONTRAST:  50mL OMNIPAQUE IOHEXOL 300 MG/ML SOLN, OMNIPAQUE IOHEXOL 300 MG/ML SOLN  COMPARISON:  03/24/2012 CT abdomen and pelvis. 06/28/2013 chest x-ray.  FINDINGS: CT CHEST  Consolidation superior medial segment of the right lower lobe surrounding bronchi and vessels. Etiology indeterminate. This may represent infectious process. Recommend treating any clinically suspected infectious infiltrate aggressively with short term followup chest CT to exclude the possibility of malignancy. The bronchi adjacent to this region are patent. Vessels extending into this region are patent without central pulmonary embolus noted.  Hazy parenchymal changes left upper lobe may represent pneumonia.  Degenerative changes lower cervical and thoracic spine without bony destructive lesion.  CT  ABDOMEN AND PELVIS  Cirrhotic liver. Splenomegaly. Prominent varices. Prominent ascites. The portal vein and splenic vein remain patent. No hyperenhancing lesion of the liver.  Changes of chronic pancreatitis.  Peri umbilical hernia contains fluid and fat. Fluid extends into the inguinal canal greater on the right.  Limited for detecting bowel inflammatory process or cholecystitis given the ascites. Small calcified gallstones are noted. No free intraperitoneal air.  Small bowel loops are fluid and contrast filled and slightly prominent measuring up to 3.1 cm without point of obstruction noted.  Scattered renal lesions some of which are cysts and others too small to characterize. No adrenal lesion.  Atherosclerotic type changes of the aorta without aneurysmal dilation.  Decompressed non contrast filled views of the urinary bladder without gross abnormality.  Coarse calcifications of the prostate gland.  IMPRESSION: Consolidation superior medial segment of the right lower lobe. Etiology indeterminate. This may represent infectious process. Recommend treating any clinically suspected infectious infiltrate aggressively with short term followup chest CT to exclude the possibility of malignancy.  Hazy parenchymal changes left upper lobe may represent pneumonia.  Cirrhotic liver. Splenomegaly. Prominent varices. Prominent ascites. The portal vein and splenic vein remain patent.  Changes of chronic pancreatitis.  Peri umbilical hernia contains fluid and fat. Fluid extends into the inguinal canal greater on the right.  Limited for detecting bowel inflammatory process or cholecystitis given the ascites. Small calcified gallstones are noted. No free intraperitoneal air.  Small bowel loops are fluid and contrast filled and slightly prominent measuring up to 3.1 cm without point of obstruction noted.  Gastric folds appear slightly thickened which may be related to under distension although gastritis not excluded.  Scattered  renal lesions some of which are cysts and others too small to characterize.  Atherosclerotic type changes of the aorta without aneurysmal dilation.  Electronically Signed: By: Bridgett Larsson M.D. On: 06/28/2013 15:37   Ct Abdomen Pelvis W Contrast  06/28/2013   ADDENDUM REPORT: 06/28/2013 16:26  ADDENDUM: Minimally  displaced fracture of of the right 8th and 10th rib.   Electronically Signed   By: Bridgett Larsson M.D.   On: 06/28/2013 16:26   06/28/2013   CLINICAL DATA:  Shortness of breath. Abdominal pain. Distention. Ascites. History of appendectomy.  EXAM: CT CHEST, ABDOMEN, AND PELVIS  WITH CONTRAST  TECHNIQUE: Multidetector CT imaging of the chest, abdomen and pelvis was performed following the standard protocol during bolus administration of intravenous contrast.  CONTRAST:  50mL OMNIPAQUE IOHEXOL 300 MG/ML SOLN, OMNIPAQUE IOHEXOL 300 MG/ML SOLN  COMPARISON:  03/24/2012 CT abdomen and pelvis. 06/28/2013 chest x-ray.  FINDINGS: CT CHEST  Consolidation superior medial segment of the right lower lobe surrounding bronchi and vessels. Etiology indeterminate. This may represent infectious process. Recommend treating any clinically suspected infectious infiltrate aggressively with short term followup chest CT to exclude the possibility of malignancy. The bronchi adjacent to this region are patent. Vessels extending into this region are patent without central pulmonary embolus noted.  Hazy parenchymal changes left upper lobe may represent pneumonia.  Degenerative changes lower cervical and thoracic spine without bony destructive lesion.  CT ABDOMEN AND PELVIS  Cirrhotic liver. Splenomegaly. Prominent varices. Prominent ascites. The portal vein and splenic vein remain patent. No hyperenhancing lesion of the liver.  Changes of chronic pancreatitis.  Peri umbilical hernia contains fluid and fat. Fluid extends into the inguinal canal greater on the right.  Limited for detecting bowel inflammatory process or  cholecystitis given the ascites. Small calcified gallstones are noted. No free intraperitoneal air.  Small bowel loops are fluid and contrast filled and slightly prominent measuring up to 3.1 cm without point of obstruction noted.  Scattered renal lesions some of which are cysts and others too small to characterize. No adrenal lesion.  Atherosclerotic type changes of the aorta without aneurysmal dilation.  Decompressed non contrast filled views of the urinary bladder without gross abnormality.  Coarse calcifications of the prostate gland.  IMPRESSION: Consolidation superior medial segment of the right lower lobe. Etiology indeterminate. This may represent infectious process. Recommend treating any clinically suspected infectious infiltrate aggressively with short term followup chest CT to exclude the possibility of malignancy.  Hazy parenchymal changes left upper lobe may represent pneumonia.  Cirrhotic liver. Splenomegaly. Prominent varices. Prominent ascites. The portal vein and splenic vein remain patent.  Changes of chronic pancreatitis.  Peri umbilical hernia contains fluid and fat. Fluid extends into the inguinal canal greater on the right.  Limited for detecting bowel inflammatory process or cholecystitis given the ascites. Small calcified gallstones are noted. No free intraperitoneal air.  Small bowel loops are fluid and contrast filled and slightly prominent measuring up to 3.1 cm without point of obstruction noted.  Gastric folds appear slightly thickened which may be related to under distension although gastritis not excluded.  Scattered renal lesions some of which are cysts and others too small to characterize.  Atherosclerotic type changes of the aorta without aneurysmal dilation.  Electronically Signed: By: Bridgett Larsson M.D. On: 06/28/2013 15:37    EKG Interpretation   None       MDM   1. Ascites   2. Alcohol abuse   3. Rib fractures, right, closed, initial encounter   4. Community  acquired pneumonia    Pt with a long standing history of cirrhosis presents due to right flank/rib pain and increase distention.  The patient reports Dr. Loreta Ave has placed him on Furosamide 20 mg BID and Spironolactone 50 mg BID to decrease the abdominal distention. Labs,  imaging order, PT sent, CIWA protocol initiated.  XR to assess for pneumonia or pleural effusion or pulmonary edema.  The patient request to be placed on CIWA protocol.  Declines pain medication at this time.  Discussed patient history and exam with Dr. Loreta Ave who request to rule out kidney stone, [place] to evaluate gall bladder etiology, then CT w and wo contrast.   Discussed radiology results with Dr. Ulyses Southward, Radiology.  Suspected pulmonary mass in the superior segment of the right lower lobe; CT chest with contrast recommended for further evaluation. Fractures of the right 8th and 10th ribs. CT chest,abdomen and pelvis with contrast ordered.  1415 Discussed XR findings with patient and patient's family. Discussed need for a CT to evaluate the mass.  They reports understanding and the patient is requesting pain medication at this time.  [id number] treating infectious process.  Discussed patient history with Dr. Lynelle Doctor, questions recent hospitalization for pneumonia therapy.  Discussed image results with the patient and he reports he only had a fever yesterday, he reports checking his temperature once yesterday but states he "felt flushed" all day.  Contributes cough to chronic sinusitis. He states he has not been on a antibiotic recently and the last hospitalization was over a year ago.  Ceftriaxone and Azithomycin ordered for CAP. Ceftriaxone and Azithomycin D/C. Levofloxacin ordered per Dr. Delford Field request.  Dr. Lynelle Doctor consulted the hospitalists and request Vancomycin and Zosyn. D/C Levofloxacin.  Meds given in ED:  Medications  vancomycin (VANCOCIN) IVPB 1000 mg/200 mL premix (not administered)  piperacillin-tazobactam (ZOSYN)  IVPB 3.375 g (not administered)  iohexol (OMNIPAQUE) 300 MG/ML solution 50 mL (50 mLs Oral Contrast Given 06/28/13 1411)  HYDROmorphone (DILAUDID) injection 0.5 mg (0.5 mg Intravenous Given 06/28/13 1426)  iohexol (OMNIPAQUE) 300 MG/ML solution 100 mL (100 mLs Intravenous Contrast Given 06/28/13 1457)    New Prescriptions   No medications on file       Clabe Seal, PA-C 06/28/13 1710

## 2013-06-28 NOTE — Progress Notes (Signed)
   CARE MANAGEMENT ED NOTE 06/28/2013  Patient:  Alexander Burch, Alexander Burch   Account Number:  [id number]  Date Initiated:  06/28/2013  Documentation initiated by:  Edd Arbour  Subjective/Objective Assessment:   52 yr old male bcbs [place] ppo pt dx with ascites NA 128 PT 17.6 given Dilaudid iv in [facility] ED Ordered a low NA diet Lasix iv 40 mg iv qd with consideration for paracentesis per H&P     Subjective/Objective Assessment Detail:     Action/Plan:   UR completed   Action/Plan Detail:   Anticipated DC Date:  07/01/2013     Status Recommendation to Physician:   Result of Recommendation:    Other ED Services  Consult Working Plan    DC Planning Services  Other    Choice offered to / List presented to:            Status of service:  Completed, signed off  ED Comments:   ED Comments Detail:

## 2013-06-28 NOTE — ED Provider Notes (Addendum)
Pt has had ascites for the past month, states SOB b/o not being able to breathe deeply. He reports pain in his right posterior chest that started last night. Has pain with movement and deep breathing. Has had fever to 100 the past 2-3 days. Has mild chronic cough from sinus disease.   PT has marked distended abdomen. He is in no respiratory distress, color is pale  16:51 Dr Rhona Leavens admit to tele, wants vancomycin and zosyn to be started.    Medical screening examination/treatment/procedure(s) were conducted as a shared visit with non-physician practitioner(s) and myself.  I personally evaluated the patient during the encounter.  EKG Interpretation   None         Devoria Albe, MD, Armando Gang   Ward Givens, MD 06/28/13 1636  Ward Givens, MD 06/28/13 [phone]

## 2013-06-28 NOTE — ED Notes (Addendum)
Pt having right flank pain that is tender to touch since yesterday. His abdominal swelling (gross ascites)  for 3 weeks. He is short of breath at this time.

## 2013-06-28 NOTE — H&P (Addendum)
[facility] History and Physical  MAYJOR AGER AVW:[id number] DOB: [birth year] DOA: 06/28/2013  Referring physician: Emergency department PCP: Warrick Parisian, MD  Specialists:   Chief Complaint: Worsening ascites  HPI: Alexander Burch is a 52 y.o. male  With a hx of active ETOH abuse with thrombocytopenia and current w/u for liver disease. Pt presents with worsening abdominal girth and swelling. Pt had been continued on lasix with spironolactone prior to ED visit. In the ED, pt was noted to have cxr findings worrisome for a right sided "mass." F/u CT with findings suggestive of possible infection. Given worsening ascites, the hospitalist service was consulted for admission.  Review of Systems: Per above, the remainder of the 10pt ros reviewed and are neg  Past Medical History  Diagnosis Date  . Thrombocytopenia, unspecified 04/25/2013  . Alcoholism   . Liver cirrhosis   . Pancreatitis   . Hernia    Past Surgical History  Procedure Laterality Date  . Appendectomy  2001  . Small intestine abcess  2001   Social History:  reports that he has been smoking Cigarettes.  He has been smoking about 0.00 packs per day. He has never used smokeless tobacco. He reports that he drinks alcohol. He reports that he does not use illicit drugs.  where does patient live--home, ALF, SNF? and with whom if at home?  Can patient participate in ADLs?  No Known Allergies  History reviewed. No pertinent family history.  (be sure to complete)  Prior to Admission medications   Medication Sig Start Date End Date Taking? Authorizing Provider  furosemide (LASIX) 20 MG tablet Take 20 mg by mouth 2 (two) times daily.   Yes Historical Provider, MD  Multiple Vitamin (MULTIVITAMIN) tablet Take 1 tablet by mouth daily.   Yes Historical Provider, MD  Pancrelipase, Lip-Prot-Amyl, (ZENPEP) 20000 UNITS CPEP Take 1 capsule by mouth every morning.    Yes Historical Provider, MD  promethazine (PHENERGAN) 25 MG  tablet Take 25 mg by mouth every 8 (eight) hours as needed for nausea.   Yes Historical Provider, MD  RABEprazole (ACIPHEX) 20 MG tablet Take 20 mg by mouth daily.   Yes Historical Provider, MD  sertraline (ZOLOFT) 100 MG tablet Take 100 mg by mouth daily.   Yes Historical Provider, MD  Simethicone (GAS-X EXTRA STRENGTH PO) Take 1 tablet by mouth daily as needed (gas).   Yes Historical Provider, MD  spironolactone (ALDACTONE) 50 MG tablet Take 50 mg by mouth 2 (two) times daily.   Yes Historical Provider, MD   Physical Exam: Filed Vitals:   06/28/13 1134 06/28/13 1322 06/28/13 1429  BP: 130/75 130/75 131/76  Pulse: 78 78 79  Temp: 97.8 F (36.6 C)    TempSrc: Oral    Resp: 18  17  Height: 6' (1.829 m)    Weight: 72.576 kg (160 lb)    SpO2: 99%  98%     General:  Awake, in nad  Eyes: PERRL B, jaundiced  ENT: membranes moist, dentition fair  Neck: trachea midline, neck supple  Cardiovascular: regular, s1, s2  Respiratory: normal resp effort, no wheezing  Abdomen: distended, + fluid wave  Skin: +spider angiomas, caput medusae, normal skin turgor  Musculoskeletal: perfused, no clubbing  Psychiatric: appears depressed, tearful, no auditory/visual hallucinations  Neurologic: cn2-12 grossly intact, strength/sensation intact  Labs on Admission:  Basic Metabolic Panel:  Recent Labs Lab 06/28/13 1215  NA 128*  K 3.8  CL 94*  CO2 23  GLUCOSE 101*  BUN 5*  CREATININE 0.59  CALCIUM 8.8   Liver Function Tests:  Recent Labs Lab 06/28/13 1215  AST 79*  ALT 26  ALKPHOS 307*  BILITOT 3.6*  PROT 8.3  ALBUMIN 2.7*   No results found for this basename: LIPASE, AMYLASE,  in the last 168 hours No results found for this basename: AMMONIA,  in the last 168 hours CBC:  Recent Labs Lab 06/28/13 1215  WBC 8.7  NEUTROABS 6.5  HGB 12.5*  HCT 36.7*  MCV 100.0  PLT 128*   Cardiac Enzymes: No results found for this basename: CKTOTAL, CKMB, CKMBINDEX, TROPONINI,  in  the last 168 hours  BNP (last 3 results) No results found for this basename: PROBNP,  in the last 8760 hours CBG: No results found for this basename: GLUCAP,  in the last 168 hours  Radiological Exams on Admission: Dg Chest 2 View  06/28/2013   CLINICAL DATA:  Right side lower posterior chest and back pain, smoker, ascites, cirrhosis  EXAM: CHEST  2 VIEW  COMPARISON:  None  FINDINGS: Normal heart size.  Abnormal soft tissue density at right hilum on frontal view corresponds to density posteriorly on the lateral view likely a retro hilar mass in the superior segment of the right lower lobe.  Remaining lungs clear.  No pleural effusion or pneumothorax.  Mild elevation of right diaphragm noted.  Fractures seen at lateral right 8th rib and posterolateral right 10th rib.  IMPRESSION: Suspected pulmonary mass in the superior segment of the right lower lobe; CT chest with contrast recommended for further evaluation.  Fractures of the right 8th and 10th ribs as above.  Findings called to Mellody Drown on 06/28/2013 at 1350 hr.   Electronically Signed   By: Ulyses Southward M.D.   On: 06/28/2013 13:50   Ct Chest W Contrast  06/28/2013   ADDENDUM REPORT: 06/28/2013 16:26  ADDENDUM: Minimally displaced fracture of of the right 8th and 10th rib.   Electronically Signed   By: Bridgett Larsson M.D.   On: 06/28/2013 16:26   06/28/2013   CLINICAL DATA:  Shortness of breath. Abdominal pain. Distention. Ascites. History of appendectomy.  EXAM: CT CHEST, ABDOMEN, AND PELVIS  WITH CONTRAST  TECHNIQUE: Multidetector CT imaging of the chest, abdomen and pelvis was performed following the standard protocol during bolus administration of intravenous contrast.  CONTRAST:  50mL OMNIPAQUE IOHEXOL 300 MG/ML SOLN, OMNIPAQUE IOHEXOL 300 MG/ML SOLN  COMPARISON:  03/24/2012 CT abdomen and pelvis. 06/28/2013 chest x-ray.  FINDINGS: CT CHEST  Consolidation superior medial segment of the right lower lobe surrounding bronchi and vessels.  Etiology indeterminate. This may represent infectious process. Recommend treating any clinically suspected infectious infiltrate aggressively with short term followup chest CT to exclude the possibility of malignancy. The bronchi adjacent to this region are patent. Vessels extending into this region are patent without central pulmonary embolus noted.  Hazy parenchymal changes left upper lobe may represent pneumonia.  Degenerative changes lower cervical and thoracic spine without bony destructive lesion.  CT ABDOMEN AND PELVIS  Cirrhotic liver. Splenomegaly. Prominent varices. Prominent ascites. The portal vein and splenic vein remain patent. No hyperenhancing lesion of the liver.  Changes of chronic pancreatitis.  Peri umbilical hernia contains fluid and fat. Fluid extends into the inguinal canal greater on the right.  Limited for detecting bowel inflammatory process or cholecystitis given the ascites. Small calcified gallstones are noted. No free intraperitoneal air.  Small bowel loops are fluid and contrast filled and slightly prominent measuring up to  3.1 cm without point of obstruction noted.  Scattered renal lesions some of which are cysts and others too small to characterize. No adrenal lesion.  Atherosclerotic type changes of the aorta without aneurysmal dilation.  Decompressed non contrast filled views of the urinary bladder without gross abnormality.  Coarse calcifications of the prostate gland.  IMPRESSION: Consolidation superior medial segment of the right lower lobe. Etiology indeterminate. This may represent infectious process. Recommend treating any clinically suspected infectious infiltrate aggressively with short term followup chest CT to exclude the possibility of malignancy.  Hazy parenchymal changes left upper lobe may represent pneumonia.  Cirrhotic liver. Splenomegaly. Prominent varices. Prominent ascites. The portal vein and splenic vein remain patent.  Changes of chronic pancreatitis.  Peri  umbilical hernia contains fluid and fat. Fluid extends into the inguinal canal greater on the right.  Limited for detecting bowel inflammatory process or cholecystitis given the ascites. Small calcified gallstones are noted. No free intraperitoneal air.  Small bowel loops are fluid and contrast filled and slightly prominent measuring up to 3.1 cm without point of obstruction noted.  Gastric folds appear slightly thickened which may be related to under distension although gastritis not excluded.  Scattered renal lesions some of which are cysts and others too small to characterize.  Atherosclerotic type changes of the aorta without aneurysmal dilation.  Electronically Signed: By: Bridgett Larsson M.D. On: 06/28/2013 15:37   Ct Abdomen Pelvis W Contrast  06/28/2013   ADDENDUM REPORT: 06/28/2013 16:26  ADDENDUM: Minimally displaced fracture of of the right 8th and 10th rib.   Electronically Signed   By: Bridgett Larsson M.D.   On: 06/28/2013 16:26   06/28/2013   CLINICAL DATA:  Shortness of breath. Abdominal pain. Distention. Ascites. History of appendectomy.  EXAM: CT CHEST, ABDOMEN, AND PELVIS  WITH CONTRAST  TECHNIQUE: Multidetector CT imaging of the chest, abdomen and pelvis was performed following the standard protocol during bolus administration of intravenous contrast.  CONTRAST:  50mL OMNIPAQUE IOHEXOL 300 MG/ML SOLN, OMNIPAQUE IOHEXOL 300 MG/ML SOLN  COMPARISON:  03/24/2012 CT abdomen and pelvis. 06/28/2013 chest x-ray.  FINDINGS: CT CHEST  Consolidation superior medial segment of the right lower lobe surrounding bronchi and vessels. Etiology indeterminate. This may represent infectious process. Recommend treating any clinically suspected infectious infiltrate aggressively with short term followup chest CT to exclude the possibility of malignancy. The bronchi adjacent to this region are patent. Vessels extending into this region are patent without central pulmonary embolus noted.  Hazy parenchymal changes  left upper lobe may represent pneumonia.  Degenerative changes lower cervical and thoracic spine without bony destructive lesion.  CT ABDOMEN AND PELVIS  Cirrhotic liver. Splenomegaly. Prominent varices. Prominent ascites. The portal vein and splenic vein remain patent. No hyperenhancing lesion of the liver.  Changes of chronic pancreatitis.  Peri umbilical hernia contains fluid and fat. Fluid extends into the inguinal canal greater on the right.  Limited for detecting bowel inflammatory process or cholecystitis given the ascites. Small calcified gallstones are noted. No free intraperitoneal air.  Small bowel loops are fluid and contrast filled and slightly prominent measuring up to 3.1 cm without point of obstruction noted.  Scattered renal lesions some of which are cysts and others too small to characterize. No adrenal lesion.  Atherosclerotic type changes of the aorta without aneurysmal dilation.  Decompressed non contrast filled views of the urinary bladder without gross abnormality.  Coarse calcifications of the prostate gland.  IMPRESSION: Consolidation superior medial segment of the right lower lobe.  Etiology indeterminate. This may represent infectious process. Recommend treating any clinically suspected infectious infiltrate aggressively with short term followup chest CT to exclude the possibility of malignancy.  Hazy parenchymal changes left upper lobe may represent pneumonia.  Cirrhotic liver. Splenomegaly. Prominent varices. Prominent ascites. The portal vein and splenic vein remain patent.  Changes of chronic pancreatitis.  Peri umbilical hernia contains fluid and fat. Fluid extends into the inguinal canal greater on the right.  Limited for detecting bowel inflammatory process or cholecystitis given the ascites. Small calcified gallstones are noted. No free intraperitoneal air.  Small bowel loops are fluid and contrast filled and slightly prominent measuring up to 3.1 cm without point of obstruction  noted.  Gastric folds appear slightly thickened which may be related to under distension although gastritis not excluded.  Scattered renal lesions some of which are cysts and others too small to characterize.  Atherosclerotic type changes of the aorta without aneurysmal dilation.  Electronically Signed: By: Bridgett Larsson M.D. On: 06/28/2013 15:37    Assessment/Plan Principal Problem:   Ascites Active Problems:   Thrombocytopenia, unspecified   Alcohol abuse   Liver disease   1. Ascites 1. Will admit pt to med-tele (see below) 2. Cont on aggressive diuresis and monitor i/o closely 3. Avoid hepatotoxic meds 4. Consider US guided paracentesis 2. Thrombocytopenia 1. Likely related to liver disease, likely alcoholic 2. Follow LFT's 3. ETOH abuse 1. Last ETOH intake was yesterday 2. Keep on CIWA protocol 3. Keep on monitor should pt go into DT's 4. Lung lesion 1. CT findings suggestive of infectious process 2.  given hx of etoh abuse, consider vanc/zosyn to cover for poss aspiration PNA 3. Would re-image after clinically improved for interval change 5. DVT prophylaxis 1. SCD's  Code Status: Full (must indicate code status--if unknown or must be presumed, indicate so) Family Communication: Pt in room (indicate person spoken with, if applicable, with phone number if by telephone) Disposition Plan: Pending (indicate anticipated LOS)  Time spent:  CHIU, STEPHEN K [facility] Pager [phone]  If 7PM-7AM, please contact night-coverage [email or web address] Password [email or web address] 06/28/2013, 4:59 PM

## 2013-06-28 NOTE — ED Notes (Addendum)
Pt presents to ed from Dr Loreta Ave office with c/o abdominal pain and distention with ascites. Pt reports abdominal swelling for about 2 weeks now, sts pain started yesterday and is under left rib cage. Pt sts he drinks 4-5 alcohol drinks a day and will need something for dt's. sts last drink was yesterday.

## 2013-06-28 NOTE — Progress Notes (Signed)
ANTIBIOTIC CONSULT NOTE - INITIAL  Pharmacy Consult for Vancomycin & Zosyn Indication: Suspected Aspiration Pneumonia  No Known Allergies  Patient Measurements: Height: 6' (182.9 cm) Weight: 160 lb (72.576 kg) IBW/kg (Calculated) : 77.6  Vital Signs: Temp: 97.8 F (36.6 C) (11/18 1134) Temp src: Oral (11/18 1134) BP: 131/76 mmHg (11/18 1429) Pulse Rate: 79 (11/18 1429) Intake/Output from previous day:   Intake/Output from this shift:    Labs:  Recent Labs  06/28/13 1215  WBC 8.7  HGB 12.5*  PLT 128*  CREATININE 0.59   Estimated Creatinine Clearance: 110.9 ml/min (by C-G formula based on Cr of 0.59). No results found for this basename: VANCOTROUGH, VANCOPEAK, VANCORANDOM, GENTTROUGH, GENTPEAK, GENTRANDOM, TOBRATROUGH, TOBRAPEAK, TOBRARND, AMIKACINPEAK, AMIKACINTROU, AMIKACIN,  in the last 72 hours   Microbiology: No results found for this or any previous visit (from the past 720 hour(s)).  Medical History: Past Medical History  Diagnosis Date  . Thrombocytopenia, unspecified 04/25/2013  . Alcoholism   . Liver cirrhosis   . Pancreatitis   . Hernia     Medications:  Scheduled:  . folic acid  1 mg Oral Daily  . furosemide  40 mg Intravenous Daily  . lipase/protease/amylase  1 capsule Oral q1800  . multivitamin with minerals  1 tablet Oral Daily  . pantoprazole  40 mg Oral Daily  . sertraline  100 mg Oral Daily  . sodium chloride  3 mL Intravenous Q12H  . thiamine  100 mg Oral Daily   Or  . thiamine  100 mg Intravenous Daily   Infusions:  . piperacillin-tazobactam    . [START ON 06/29/2013] piperacillin-tazobactam (ZOSYN)  IV    . vancomycin    . [START ON 06/29/2013] vancomycin     Assessment:  52 yr male presents to ED with worsening ascites.  H/O ETOH abuse, thrombocytopenia  CT Chest shows changes which may represent an infectious process  Vancomycin 1gm and Zosyn 3.375gm IV x 1 sent to ED  Pharmacy asked to dose Vancomycin and Zosyn for  possible aspiration pneumonia  Goal of Therapy:  Vancomycin trough level 15-20 mcg/ml  Plan:  Measure antibiotic drug levels at steady state Follow up culture results Zosyn 3.375gm IV q8h (each dose infused over 4 hrs) Vancomycin 1000mg  IV q8h  Virgil Lightner, Joselyn Glassman, PharmD 06/28/2013,5:51 PM

## 2013-06-29 ENCOUNTER — Inpatient Hospital Stay (HOSPITAL_COMMUNITY): Payer: BC Managed Care – PPO

## 2013-06-29 DIAGNOSIS — D649 Anemia, unspecified: Secondary | ICD-10-CM

## 2013-06-29 DIAGNOSIS — E876 Hypokalemia: Secondary | ICD-10-CM

## 2013-06-29 DIAGNOSIS — D696 Thrombocytopenia, unspecified: Secondary | ICD-10-CM

## 2013-06-29 DIAGNOSIS — E871 Hypo-osmolality and hyponatremia: Secondary | ICD-10-CM

## 2013-06-29 DIAGNOSIS — I8500 Esophageal varices without bleeding: Secondary | ICD-10-CM

## 2013-06-29 LAB — CBC
HCT: 31.9 % — ABNORMAL LOW (ref 39.0–52.0)
Hemoglobin: 11.1 g/dL — ABNORMAL LOW (ref 13.0–17.0)
MCH: 34.5 pg — ABNORMAL HIGH (ref 26.0–34.0)
MCHC: 34.8 g/dL (ref 30.0–36.0)
RBC: 3.22 MIL/uL — ABNORMAL LOW (ref 4.22–5.81)

## 2013-06-29 LAB — LIPASE, BLOOD: Lipase: 12 U/L (ref 11–59)

## 2013-06-29 LAB — COMPREHENSIVE METABOLIC PANEL
ALT: 20 U/L (ref 0–53)
AST: 60 U/L — ABNORMAL HIGH (ref 0–37)
Alkaline Phosphatase: 255 U/L — ABNORMAL HIGH (ref 39–117)
Calcium: 8 mg/dL — ABNORMAL LOW (ref 8.4–10.5)
GFR calc Af Amer: >90 mL/min (ref >90)
Glucose, Bld: 120 mg/dL — ABNORMAL HIGH (ref 70–99)
Potassium: 3.2 mEq/L — ABNORMAL LOW (ref 3.5–5.1)
Sodium: 124 mEq/L — ABNORMAL LOW (ref 135–145)
Total Protein: 7.1 g/dL (ref 6.0–8.3)

## 2013-06-29 LAB — BODY FLUID CELL COUNT WITH DIFFERENTIAL
Lymphs, Fluid: 17 %
Monocyte-Macrophage-Serous Fluid: 78 % (ref 50–90)
Neutrophil Count, Fluid: 5 % (ref 0–25)
Total Nucleated Cell Count, Fluid: 77 cu mm (ref 0–1000)

## 2013-06-29 LAB — AMMONIA: Ammonia: 54 umol/L (ref 11–60)

## 2013-06-29 LAB — MAGNESIUM: Magnesium: 1.5 mg/dL (ref 1.5–2.5)

## 2013-06-29 MED ORDER — MAGNESIUM SULFATE 40 MG/ML IJ SOLN
2.0000 g | Freq: Once | INTRAMUSCULAR | Status: AC
  Administered 2013-06-29: 2 g via INTRAVENOUS
  Filled 2013-06-29: qty 50

## 2013-06-29 MED ORDER — LACTULOSE 10 GM/15ML PO SOLN
30.0000 g | Freq: Every day | ORAL | Status: DC
  Administered 2013-06-29 – 2013-06-30 (×2): 30 g via ORAL
  Filled 2013-06-29 (×2): qty 45

## 2013-06-29 MED ORDER — POTASSIUM CHLORIDE CRYS ER 20 MEQ PO TBCR
40.0000 meq | EXTENDED_RELEASE_TABLET | Freq: Once | ORAL | Status: AC
  Administered 2013-06-29: 40 meq via ORAL
  Filled 2013-06-29: qty 2

## 2013-06-29 MED ORDER — SPIRONOLACTONE 50 MG PO TABS
50.0000 mg | ORAL_TABLET | Freq: Two times a day (BID) | ORAL | Status: DC
  Administered 2013-06-29 – 2013-07-01 (×4): 50 mg via ORAL
  Filled 2013-06-29 (×6): qty 1

## 2013-06-29 MED ORDER — ALBUMIN HUMAN 25 % IV SOLN
37.5000 g | Freq: Once | INTRAVENOUS | Status: AC
  Administered 2013-06-29: 21:00:00 37.5 g via INTRAVENOUS
  Filled 2013-06-29: qty 150

## 2013-06-29 MED ORDER — PROPRANOLOL HCL 10 MG PO TABS
10.0000 mg | ORAL_TABLET | Freq: Two times a day (BID) | ORAL | Status: DC
  Administered 2013-06-29 – 2013-07-01 (×4): 10 mg via ORAL
  Filled 2013-06-29 (×5): qty 1

## 2013-06-29 NOTE — Progress Notes (Addendum)
INITIAL NUTRITION ASSESSMENT  DOCUMENTATION CODES Per approved criteria  -Not Applicable   INTERVENTION: Provide Snacks TID Recommend changing diet to Low Sodium Continue Multivitamin with minerals, thiamine, and folic acid supplements RD to continue to monitor and provide further intervention as needed  NUTRITION DIAGNOSIS: Increased nutrient needs related to chronic pancreatitis and alcoholic cirrhosis as evidenced by estimated needs.   Goal: Pt to meet >/= 90% of their estimated nutrition needs   Monitor:  PO intake Weight Labs  Reason for Assessment: Malnutrition Screening Tool, score of 2  52 y.o. male  Admitting Dx: Ascites  ASSESSMENT: 52 y.o. male with a hx of active ETOH abuse with thrombocytopenia and current w/u for liver disease. Pt presents with worsening abdominal girth and swelling. In the ED, pt was noted to have cxr findings worrisome for a right sided "mass." F/u CT with findings suggestive of possible infection. Given worsening ascites, the hospitalist service was consulted for admission.  Pt out of room for ultrasound at time of visit. Per MST report pt's weigh fluctuates between 156 and 165 lbs; current weight is within usual weight range. Pt also reported following a liver healthy diet PTA. Pt NPO earlier today; pt now on Hepatic Diet. Will monitor adequacy of PO intake.  Height: Ht Readings from Last 1 Encounters:  06/28/13 6' (1.829 m)    Weight: Wt Readings from Last 1 Encounters:  06/28/13 162 lb 7.7 oz (73.7 kg)    Ideal Body Weight: 178 lbs  % Ideal Body Weight: 91%  Wt Readings from Last 10 Encounters:  06/28/13 162 lb 7.7 oz (73.7 kg)  04/25/13 163 lb (73.936 kg)  03/28/13 160 lb (72.576 kg)    Usual Body Weight: 156-165 lbs  % Usual Body Weight: 100%  BMI:  Body mass index is 22.03 kg/(m^2).  Estimated Nutritional Needs: Kcal: 2200-2400 Protein: 90-110 grams Fluid: 2.2-2.4 L/day  Skin: intact  Diet Order:  Renal  EDUCATION NEEDS: -No education needs identified at this time   Intake/Output Summary (Last 24 hours) at 06/29/13 1702 Last data filed at 06/29/13 1500  Gross per 24 hour  Intake    730 ml  Output    820 ml  Net    -90 ml    Last BM: 11/17   Labs:   Recent Labs Lab 06/28/13 1215 06/29/13 0517 06/29/13 1330  NA 128* 124*  --   K 3.8 3.2*  --   CL 94* 93*  --   CO2 23 24  --   BUN 5* 9  --   CREATININE 0.59 0.62  --   CALCIUM 8.8 8.0*  --   MG  --   --  1.5  GLUCOSE 101* 120*  --     CBG (last 3)  No results found for this basename: GLUCAP,  in the last 72 hours  Scheduled Meds: . albumin human  37.5 g Intravenous Once  . folic acid  1 mg Oral Daily  . furosemide  40 mg Intravenous Daily  . lactulose  30 g Oral Daily  . lipase/protease/amylase  1 capsule Oral q1800  . magnesium sulfate 1 - 4 g bolus IVPB  2 g Intravenous Once  . multivitamin with minerals  1 tablet Oral Daily  . pantoprazole  40 mg Oral Daily  . piperacillin-tazobactam (ZOSYN)  IV  3.375 g Intravenous Q8H  . sertraline  100 mg Oral Daily  . sodium chloride  3 mL Intravenous Q12H  . spironolactone  50 mg Oral  BID  . thiamine  100 mg Oral Daily   Or  . thiamine  100 mg Intravenous Daily    Continuous Infusions:   Past Medical History  Diagnosis Date  . Thrombocytopenia, unspecified 04/25/2013  . Alcoholism   . Liver cirrhosis   . Pancreatitis   . Hernia     Past Surgical History  Procedure Laterality Date  . Appendectomy  2001  . Small intestine abcess  2001    Ian Malkin RD, LDN Inpatient Clinical Dietitian Pager: [phone] After Hours Pager: [phone]

## 2013-06-29 NOTE — ED Provider Notes (Signed)
See prior note   Ward Givens, MD 06/29/13 [phone]

## 2013-06-29 NOTE — Procedures (Signed)
Successful US guided paracentesis from RLQ.  Yielded 6L of dark yellow fluid.  No immediate complications.  Pt tolerated well.   Specimen was sent for labs.  Brayton El PA-C 06/29/2013 2:37 PM

## 2013-06-29 NOTE — Progress Notes (Addendum)
[facility] PROGRESS NOTE  STEADMAN PROSPERI WUJ:[id number] DOB: 09-Jul-1961 DOA: 06/28/2013 PCP: Warrick Parisian, MD  Assessment/Plan  Ascites due to alcoholic cirrhosis -  Paracentesis today -  Continue iv lasix 40mg  daily -  Restart spironolactone 50mg  BID -  Add on lipase  EtOH cirrhosis with complications of ascites, varices, hyponatremia, thrombocytopenia with ongoing EtOH use, MELD Mayo 11, MELD UNOS 16 -  Followed by Dr. Loreta Ave -  Add on ammonia level  ETOH abuse, last ETOH intake Monday night, CIWA scores rising 8-9 -  Continue CIWA protocol -  If scores are rising > 10 consistently, will increase frequency of CIWA scores -  Continue thiamine, folate, MVI -  Social work consult   Aspiration pneumonia suggested by CT chest, however, unable to rule out underlying malignancy  -  D/c vancomycin -  Continue zosyn -  Plan to repeat CT in 2-4 weeks to ensure resolution    Chronic pancreatitis, stable.  Continue creon  Hyponatremia, likely related to Cirrhosis, however, may also be side effect of zoloft.   -  Defer changes to zoloft to PCP as transitioning to alternative medication will require close follow up   Borderline macrocytic anemia, likely secondary to EtOH marrow suppression -  Encouraged EtOH cessation  Thrombocytopenia followed by Dr. Myna Hidalgo, Oncology.  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   -  Trend CBC  Constipation -  Start daily lactulose  Hypokalemia, likely secondary to lasix and poor dietary intake  -  Oral KCl -  Check magnesium level  Diet:  NPO pending paracentesis, then hepatic diet Access:  PIV IVF:  OFF Proph:  SCDs  Code Status: full Family Communication: patient alone Disposition Plan: pending decreasing CIWA scores, improvement in abdominal distension  Consultants:  Radiology for US paracentesis  Procedures:  US guided paracentesis 11/19  Antibiotics:  Vancomycin  11/18 >>  Zosyn 11/ 18 >>   HPI/Subjective:  States he has some mild alcohol withdrawal tremor and his abdomen still feels distended and uncomfortable.  Denies fevers, chills.  Mild nausea without vomiting.  May be getting a little constipated.      Objective: Filed Vitals:   06/28/13 1704 06/28/13 1815 06/28/13 2034 06/29/13 0554  BP:  148/89 133/90 121/73  Pulse:  84 93 85  Temp:  97.9 F (36.6 C) 98.4 F (36.9 C) 98.3 F (36.8 C)  TempSrc:  Oral Oral Oral  Resp:  18 20 18   Height:  6' (1.829 m)    Weight:  73.7 kg (162 lb 7.7 oz)    SpO2: 97% 98% 99% 96%    Intake/Output Summary (Last 24 hours) at 06/29/13 1242 Last data filed at 06/29/13 0600  Gross per 24 hour  Intake    730 ml  Output    620 ml  Net    110 ml   Filed Weights   06/28/13 1134 06/28/13 1815  Weight: 72.576 kg (160 lb) 73.7 kg (162 lb 7.7 oz)    Exam:   General:  Mildly jaundiced CM, No acute distress  HEENT:  NCAT, MMM, mild icterus  Cardiovascular:  RRR, nl S1, S2 no mrg, 2+ pulses, warm extremities  Respiratory:  CTAB, no increased WOB  Abdomen:   NABS, soft, moderately distended, nontender to palpation  MSK:   Normal tone and bulk, no LEE  Neuro:  Slightly slow to answer questions  Skin:  Many spider angiomas and telangiectasias  Data Reviewed: Basic Metabolic Panel:  Recent Labs Lab 06/28/13 1215 06/29/13 0517  NA 128* 124*  K 3.8 3.2*  CL 94* 93*  CO2 23 24  GLUCOSE 101* 120*  BUN 5* 9  CREATININE 0.59 0.62  CALCIUM 8.8 8.0*   Liver Function Tests:  Recent Labs Lab 06/28/13 1215 06/29/13 0517  AST 79* 60*  ALT 26 20  ALKPHOS 307* 255*  BILITOT 3.6* 3.3*  PROT 8.3 7.1  ALBUMIN 2.7* 2.4*   No results found for this basename: LIPASE, AMYLASE,  in the last 168 hours No results found for this basename: AMMONIA,  in the last 168 hours CBC:  Recent Labs Lab 06/28/13 1215 06/29/13 0517  WBC 8.7 9.3  NEUTROABS 6.5  --   HGB 12.5* 11.1*  HCT 36.7* 31.9*   MCV 100.0 99.1  PLT 128* 115*   Cardiac Enzymes: No results found for this basename: CKTOTAL, CKMB, CKMBINDEX, TROPONINI,  in the last 168 hours BNP (last 3 results) No results found for this basename: PROBNP,  in the last 8760 hours CBG: No results found for this basename: GLUCAP,  in the last 168 hours  No results found for this or any previous visit (from the past 240 hour(s)).   Studies: Dg Chest 2 View  06/28/2013   CLINICAL DATA:  Right side lower posterior chest and back pain, smoker, ascites, cirrhosis  EXAM: CHEST  2 VIEW  COMPARISON:  None  FINDINGS: Normal heart size.  Abnormal soft tissue density at right hilum on frontal view corresponds to density posteriorly on the lateral view likely a retro hilar mass in the superior segment of the right lower lobe.  Remaining lungs clear.  No pleural effusion or pneumothorax.  Mild elevation of right diaphragm noted.  Fractures seen at lateral right 8th rib and posterolateral right 10th rib.  IMPRESSION: Suspected pulmonary mass in the superior segment of the right lower lobe; CT chest with contrast recommended for further evaluation.  Fractures of the right 8th and 10th ribs as above.  Findings called to Mellody Drown on 06/28/2013 at 1350 hr.   Electronically Signed   By: Ulyses Southward M.D.   On: 06/28/2013 13:50   Ct Chest W Contrast  06/28/2013   ADDENDUM REPORT: 06/28/2013 16:26  ADDENDUM: Minimally displaced fracture of of the right 8th and 10th rib.   Electronically Signed   By: Bridgett Larsson M.D.   On: 06/28/2013 16:26   06/28/2013   CLINICAL DATA:  Shortness of breath. Abdominal pain. Distention. Ascites. History of appendectomy.  EXAM: CT CHEST, ABDOMEN, AND PELVIS  WITH CONTRAST  TECHNIQUE: Multidetector CT imaging of the chest, abdomen and pelvis was performed following the standard protocol during bolus administration of intravenous contrast.  CONTRAST:  50mL OMNIPAQUE IOHEXOL 300 MG/ML SOLN, OMNIPAQUE IOHEXOL 300 MG/ML SOLN   COMPARISON:  03/24/2012 CT abdomen and pelvis. 06/28/2013 chest x-ray.  FINDINGS: CT CHEST  Consolidation superior medial segment of the right lower lobe surrounding bronchi and vessels. Etiology indeterminate. This may represent infectious process. Recommend treating any clinically suspected infectious infiltrate aggressively with Evalina Tabak term followup chest CT to exclude the possibility of malignancy. The bronchi adjacent to this region are patent. Vessels extending into this region are patent without central pulmonary embolus noted.  Hazy parenchymal changes left upper lobe may represent pneumonia.  Degenerative changes lower cervical and thoracic spine without bony destructive lesion.  CT ABDOMEN AND PELVIS  Cirrhotic liver. Splenomegaly. Prominent varices. Prominent ascites. The portal vein and splenic vein remain patent. No  hyperenhancing lesion of the liver.  Changes of chronic pancreatitis.  Peri umbilical hernia contains fluid and fat. Fluid extends into the inguinal canal greater on the right.  Limited for detecting bowel inflammatory process or cholecystitis given the ascites. Small calcified gallstones are noted. No free intraperitoneal air.  Small bowel loops are fluid and contrast filled and slightly prominent measuring up to 3.1 cm without point of obstruction noted.  Scattered renal lesions some of which are cysts and others too small to characterize. No adrenal lesion.  Atherosclerotic type changes of the aorta without aneurysmal dilation.  Decompressed non contrast filled views of the urinary bladder without gross abnormality.  Coarse calcifications of the prostate gland.  IMPRESSION: Consolidation superior medial segment of the right lower lobe. Etiology indeterminate. This may represent infectious process. Recommend treating any clinically suspected infectious infiltrate aggressively with Noell Shular term followup chest CT to exclude the possibility of malignancy.  Hazy parenchymal changes left upper  lobe may represent pneumonia.  Cirrhotic liver. Splenomegaly. Prominent varices. Prominent ascites. The portal vein and splenic vein remain patent.  Changes of chronic pancreatitis.  Peri umbilical hernia contains fluid and fat. Fluid extends into the inguinal canal greater on the right.  Limited for detecting bowel inflammatory process or cholecystitis given the ascites. Small calcified gallstones are noted. No free intraperitoneal air.  Small bowel loops are fluid and contrast filled and slightly prominent measuring up to 3.1 cm without point of obstruction noted.  Gastric folds appear slightly thickened which may be related to under distension although gastritis not excluded.  Scattered renal lesions some of which are cysts and others too small to characterize.  Atherosclerotic type changes of the aorta without aneurysmal dilation.  Electronically Signed: By: Bridgett Larsson M.D. On: 06/28/2013 15:37   Ct Abdomen Pelvis W Contrast  06/28/2013   ADDENDUM REPORT: 06/28/2013 16:26  ADDENDUM: Minimally displaced fracture of of the right 8th and 10th rib.   Electronically Signed   By: Bridgett Larsson M.D.   On: 06/28/2013 16:26   06/28/2013   CLINICAL DATA:  Shortness of breath. Abdominal pain. Distention. Ascites. History of appendectomy.  EXAM: CT CHEST, ABDOMEN, AND PELVIS  WITH CONTRAST  TECHNIQUE: Multidetector CT imaging of the chest, abdomen and pelvis was performed following the standard protocol during bolus administration of intravenous contrast.  CONTRAST:  50mL OMNIPAQUE IOHEXOL 300 MG/ML SOLN, OMNIPAQUE IOHEXOL 300 MG/ML SOLN  COMPARISON:  03/24/2012 CT abdomen and pelvis. 06/28/2013 chest x-ray.  FINDINGS: CT CHEST  Consolidation superior medial segment of the right lower lobe surrounding bronchi and vessels. Etiology indeterminate. This may represent infectious process. Recommend treating any clinically suspected infectious infiltrate aggressively with Nhia Heaphy term followup chest CT to exclude the  possibility of malignancy. The bronchi adjacent to this region are patent. Vessels extending into this region are patent without central pulmonary embolus noted.  Hazy parenchymal changes left upper lobe may represent pneumonia.  Degenerative changes lower cervical and thoracic spine without bony destructive lesion.  CT ABDOMEN AND PELVIS  Cirrhotic liver. Splenomegaly. Prominent varices. Prominent ascites. The portal vein and splenic vein remain patent. No hyperenhancing lesion of the liver.  Changes of chronic pancreatitis.  Peri umbilical hernia contains fluid and fat. Fluid extends into the inguinal canal greater on the right.  Limited for detecting bowel inflammatory process or cholecystitis given the ascites. Small calcified gallstones are noted. No free intraperitoneal air.  Small bowel loops are fluid and contrast filled and slightly prominent measuring up to 3.1 cm  without point of obstruction noted.  Scattered renal lesions some of which are cysts and others too small to characterize. No adrenal lesion.  Atherosclerotic type changes of the aorta without aneurysmal dilation.  Decompressed non contrast filled views of the urinary bladder without gross abnormality.  Coarse calcifications of the prostate gland.  IMPRESSION: Consolidation superior medial segment of the right lower lobe. Etiology indeterminate. This may represent infectious process. Recommend treating any clinically suspected infectious infiltrate aggressively with Brevon Dewald term followup chest CT to exclude the possibility of malignancy.  Hazy parenchymal changes left upper lobe may represent pneumonia.  Cirrhotic liver. Splenomegaly. Prominent varices. Prominent ascites. The portal vein and splenic vein remain patent.  Changes of chronic pancreatitis.  Peri umbilical hernia contains fluid and fat. Fluid extends into the inguinal canal greater on the right.  Limited for detecting bowel inflammatory process or cholecystitis given the ascites. Small  calcified gallstones are noted. No free intraperitoneal air.  Small bowel loops are fluid and contrast filled and slightly prominent measuring up to 3.1 cm without point of obstruction noted.  Gastric folds appear slightly thickened which may be related to under distension although gastritis not excluded.  Scattered renal lesions some of which are cysts and others too small to characterize.  Atherosclerotic type changes of the aorta without aneurysmal dilation.  Electronically Signed: By: Bridgett Larsson M.D. On: 06/28/2013 15:37    Scheduled Meds: . folic acid  1 mg Oral Daily  . furosemide  40 mg Intravenous Daily  . lipase/protease/amylase  1 capsule Oral q1800  . multivitamin with minerals  1 tablet Oral Daily  . pantoprazole  40 mg Oral Daily  . piperacillin-tazobactam (ZOSYN)  IV  3.375 g Intravenous Q8H  . potassium chloride  40 mEq Oral Once  . sertraline  100 mg Oral Daily  . sodium chloride  3 mL Intravenous Q12H  . thiamine  100 mg Oral Daily   Or  . thiamine  100 mg Intravenous Daily  . vancomycin  1,000 mg Intravenous Q8H   Continuous Infusions:   Principal Problem:   Ascites Active Problems:   Thrombocytopenia, unspecified   Alcohol abuse   Liver disease    Time spent: 30 min    Mirta Mally, [facility]  [facility] Pager [phone]. If 7PM-7AM, please contact night-coverage at [email or web address], password [facility] 06/29/2013, 12:42 PM  LOS: 1 day

## 2013-06-30 DIAGNOSIS — K7290 Hepatic failure, unspecified without coma: Secondary | ICD-10-CM

## 2013-06-30 DIAGNOSIS — K7682 Hepatic encephalopathy: Secondary | ICD-10-CM

## 2013-06-30 DIAGNOSIS — I8500 Esophageal varices without bleeding: Secondary | ICD-10-CM

## 2013-06-30 LAB — COMPREHENSIVE METABOLIC PANEL
AST: 68 U/L — ABNORMAL HIGH (ref 0–37)
Albumin: 2.6 g/dL — ABNORMAL LOW (ref 3.5–5.2)
BUN: 10 mg/dL (ref 6–23)
CO2: 23 mEq/L (ref 19–32)
Chloride: 94 mEq/L — ABNORMAL LOW (ref 96–112)
Creatinine, Ser: 0.67 mg/dL (ref 0.50–1.35)
GFR calc non Af Amer: >90 mL/min (ref >90)
Sodium: 128 mEq/L — ABNORMAL LOW (ref 135–145)
Total Protein: 6.6 g/dL (ref 6.0–8.3)

## 2013-06-30 LAB — CBC
HCT: 31.9 % — ABNORMAL LOW (ref 39.0–52.0)
Hemoglobin: 11 g/dL — ABNORMAL LOW (ref 13.0–17.0)
MCV: 98.8 fL (ref 78.0–100.0)
Platelets: 108 10*3/uL — ABNORMAL LOW (ref 150–400)
RBC: 3.23 MIL/uL — ABNORMAL LOW (ref 4.22–5.81)
WBC: 10.2 10*3/uL (ref 4.0–10.5)

## 2013-06-30 MED ORDER — FUROSEMIDE 40 MG PO TABS
40.0000 mg | ORAL_TABLET | Freq: Every day | ORAL | Status: DC
  Administered 2013-07-01: 11:00:00 40 mg via ORAL
  Filled 2013-06-30: qty 1

## 2013-06-30 MED ORDER — RIFAXIMIN 550 MG PO TABS
550.0000 mg | ORAL_TABLET | Freq: Two times a day (BID) | ORAL | Status: DC
  Administered 2013-06-30 – 2013-07-01 (×3): 550 mg via ORAL
  Filled 2013-06-30 (×4): qty 1

## 2013-06-30 MED ORDER — LACTULOSE 10 GM/15ML PO SOLN
30.0000 g | Freq: Every day | ORAL | Status: DC | PRN
  Filled 2013-06-30: qty 45

## 2013-06-30 MED ORDER — MAGNESIUM SULFATE 40 MG/ML IJ SOLN
2.0000 g | Freq: Once | INTRAMUSCULAR | Status: AC
  Administered 2013-06-30: 08:00:00 2 g via INTRAVENOUS
  Filled 2013-06-30: qty 50

## 2013-06-30 MED ORDER — POTASSIUM CHLORIDE CRYS ER 20 MEQ PO TBCR
40.0000 meq | EXTENDED_RELEASE_TABLET | Freq: Two times a day (BID) | ORAL | Status: DC
  Administered 2013-06-30 – 2013-07-01 (×3): 40 meq via ORAL
  Filled 2013-06-30 (×4): qty 2

## 2013-06-30 MED ORDER — TAMSULOSIN HCL 0.4 MG PO CAPS
0.4000 mg | ORAL_CAPSULE | Freq: Every day | ORAL | Status: DC
  Administered 2013-06-30: 17:00:00 0.4 mg via ORAL
  Filled 2013-06-30 (×2): qty 1

## 2013-06-30 MED ORDER — NAPROXEN 375 MG PO TABS
375.0000 mg | ORAL_TABLET | Freq: Two times a day (BID) | ORAL | Status: DC | PRN
  Administered 2013-06-30 (×2): 375 mg via ORAL
  Filled 2013-06-30 (×2): qty 1

## 2013-06-30 NOTE — Progress Notes (Signed)
Clinical Social Work Department BRIEF PSYCHOSOCIAL ASSESSMENT 06/30/2013  Patient:  Alexander Burch, Alexander Burch     Account Number:  [id number]     Admit date:  06/28/2013  Clinical Social Worker:  Dennison Bulla  Date/Time:  06/30/2013 03:30 PM  Referred by:  Physician  Date Referred:  06/30/2013 Referred for  Substance Abuse   Other Referral:   Interview type:  Patient Other interview type:    PSYCHOSOCIAL DATA Living Status:  FAMILY Admitted from facility:   Level of care:   Primary support name:  Daisy Primary support relationship to patient:  PARENT Degree of support available:   Strong    CURRENT CONCERNS Current Concerns  Substance Abuse   Other Concerns:    SOCIAL WORK ASSESSMENT / PLAN CSW received referral to assess for patient's substance abuse. CSW reviewed chart and met with patient and mom at bedside. Patient agreeable to mom's involved during the assessment.    Patient lives at home with mom and reports he is partially disabled. Patient went to college and worked as a Building control surveyor for several years. Patient reports he came to the hospital due to having broken ribs and stomach pain. Patient is aware that he should decrease his alcohol use.    Patient agreeable to discuss alcohol use. Patient reports he used to be a heavy drinker but now only drinks 4-5 beers a day. Patient began drinking when he was a teenager. Patient reports he had trouble sleeping but discovered that drinking a beer would help him sleep better. Patient denies any triggers to use or emotional connections to alcohol use. Patient reports he has been to detox at [facility] and has tried AA meetings in the past.    After completing SBIRT, CSW explained different treatment options including inpatient, intensive outpatient, outpatient and AA meetings. Patient is not receptive to treatment at this time. Patient reports "I have tried treatment in the past and I can just do it on my own this time." Mom expressed  concern about patient not being involved in AA meetings around their house and that he did not call his sponsor that often.    CSW provided [employer] and encouraged patient to review information. Patient continues to decline and treatment. CSW left contact information in case patient changes his mind. CSW is signing off but available if further needs arise.   Assessment/plan status:  Information/Referral to [facility] Other assessment/ plan:   Information/referral to community resources:   SA resources    PATIENT'S/FAMILY'S RESPONSE TO PLAN OF CARE: Patient alert and oriented. Patient engaged in assessment but appears to minimize harmful affects caused by alcohol intake. Patient aware he needs to reduce and hopefully eliminate alcohol use but does not want any treatment to assist with this goal. Patient has SA list and CSW contact information if needed.       Unk Lightning, LCSW (Coverage for [employer])

## 2013-06-30 NOTE — Progress Notes (Addendum)
[facility] PROGRESS NOTE  CONO GEBHARD ZOX:[id number] DOB: 12/04/1960 DOA: 06/28/2013 PCP: Warrick Parisian, MD  Assessment/Plan  Ascites due to alcoholic cirrhosis s/p paracentesis 11/19.   -  Transition to lasix 40mg  PO daily tomorrow -  Continue spironolactone 50mg  BID  EtOH cirrhosis with complications of ascites, varices, hyponatremia, thrombocytopenia with ongoing EtOH use, MELD Mayo 11, MELD UNOS 16 -  Followed by Dr. Loreta Ave -  Ammonia 54  Mild hepatic encephalopathy, although awake and alert and appropriate this AM, according to Dr. Loreta Ave, he does not remember me visiting him this morning and he is somewhat sleepy -  Started lactulose, however, having increased confusion today -  D/c lactulose -  Start rifaximin 550mg  PO BID.  Prior authorization will be taken care of by Dr. Kenna Gilbert office.    ETOH abuse, last ETOH intake Monday night, CIWA scores decreasing to 3-8 range -  Continue CIWA protocol -  Continue thiamine, folate, MVI -  Social work consult   Aspiration pneumonia suggested by CT chest, however, unable to rule out underlying malignancy  -  Continue zosyn -  Transition to augmentin at discharge -  Plan to repeat CT in 2-4 weeks to ensure resolution   Chronic pancreatitis, stable.  Continue creon.  Lipase wnl.    Possible urinary retention, may have some BPH -  UA with only small LE and 0-2 WBC yesterday -  Check PVR  Hyponatremia, likely related to Cirrhosis, however, may also be side effect of zoloft.   -  Defer changes to zoloft to PCP as transitioning to alternative medication will require close follow up   Borderline macrocytic anemia, likely secondary to EtOH marrow suppression -  Encouraged EtOH cessation  Thrombocytopenia followed by Dr. Myna Hidalgo, Oncology.  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   -  Trend CBC  Constipation resolved after starting lactulose -  Change to prn  lactulose  Hypokalemia, likely secondary to lasix and poor dietary intake  -  Oral KCl  Hypomagnesemia,  -  Additional magnesium sulfate IV 2gm today and plan to repeat again tomorrow  Diet:  Low sodium diet Access:  PIV IVF:  OFF Proph:  SCDs  Code Status: full Family Communication: patient alone Disposition Plan: pending decreasing CIWA scores, improvement in abdominal distension  Consultants:  Radiology for US paracentesis  Procedures:  US guided paracentesis 11/19  Antibiotics:  Vancomycin 11/18 >>11/19  Zosyn 11/ 18 >>   HPI/Subjective:  States he is having minimal alcohol withdrawal symptoms and checked himself for tremor when answering the question.  Abdomen much better after paracentesis.  Had BM yesterday.    Complains of urinary frequency and sensation of not emptying bladder fully.    Objective: Filed Vitals:   06/30/13 0608 06/30/13 0808 06/30/13 1015 06/30/13 1158  BP: 117/77 119/87 110/65 120/67  Pulse: 73 74 69 93  Temp: 98.3 F (36.8 C)     TempSrc: Oral     Resp: 18 16 16    Height:      Weight:      SpO2: 93% 99% 98%     Intake/Output Summary (Last 24 hours) at 06/30/13 1308 Last data filed at 06/30/13 0809  Gross per 24 hour  Intake    440 ml  Output    452 ml  Net    -12 ml   Filed Weights   06/28/13 1134 06/28/13 1815  Weight: 72.576 kg (160 lb) 73.7 kg (162 lb 7.7  oz)    Exam:   General:  Mildly jaundiced CM, No acute distress, awake, alert  HEENT:  NCAT, MMM, mild icterus  Cardiovascular:  RRR, nl S1, S2 no mrg, 2+ pulses, warm extremities  Respiratory:  CTAB, no increased WOB  Abdomen:   NABS, soft, moderately distended, nontender to palpation  MSK:   Normal tone and bulk, no LEE  Neuro:  Grossly intact  Skin:  Many spider angiomas and telangiectasias  Data Reviewed: Basic Metabolic Panel:  Recent Labs Lab 06/28/13 1215 06/29/13 0517 06/29/13 1330 06/30/13 0525  NA 128* 124*  --  128*  K 3.8 3.2*  --   3.1*  CL 94* 93*  --  94*  CO2 23 24  --  23  GLUCOSE 101* 120*  --  102*  BUN 5* 9  --  10  CREATININE 0.59 0.62  --  0.67  CALCIUM 8.8 8.0*  --  7.8*  MG  --   --  1.5  --    Liver Function Tests:  Recent Labs Lab 06/28/13 1215 06/29/13 0517 06/30/13 0525  AST 79* 60* 68*  ALT 26 20 18   ALKPHOS 307* 255* 206*  BILITOT 3.6* 3.3* 4.1*  PROT 8.3 7.1 6.6  ALBUMIN 2.7* 2.4* 2.6*    Recent Labs Lab 06/29/13 1330  LIPASE 12    Recent Labs Lab 06/29/13 1505  AMMONIA 54   CBC:  Recent Labs Lab 06/28/13 1215 06/29/13 0517 06/30/13 0525  WBC 8.7 9.3 10.2  NEUTROABS 6.5  --   --   HGB 12.5* 11.1* 11.0*  HCT 36.7* 31.9* 31.9*  MCV 100.0 99.1 98.8  PLT 128* 115* 108*   Cardiac Enzymes: No results found for this basename: CKTOTAL, CKMB, CKMBINDEX, TROPONINI,  in the last 168 hours BNP (last 3 results) No results found for this basename: PROBNP,  in the last 8760 hours CBG: No results found for this basename: GLUCAP,  in the last 168 hours  Recent Results (from the past 240 hour(s))  BODY FLUID CULTURE     Status: None   Collection Time    06/29/13  1:45 PM      Result Value Range Status   Specimen Description ASCITIC   Final   Special Requests NONE   Final   Gram Stain     Final   Value: NO WBC SEEN     NO ORGANISMS SEEN     Performed at [facility]   Culture     Final   Value: NO GROWTH 1 DAY     Performed at [facility]   Report Status PENDING   Incomplete     Studies: Dg Chest 2 View  06/28/2013   CLINICAL DATA:  Right side lower posterior chest and back pain, smoker, ascites, cirrhosis  EXAM: CHEST  2 VIEW  COMPARISON:  None  FINDINGS: Normal heart size.  Abnormal soft tissue density at right hilum on frontal view corresponds to density posteriorly on the lateral view likely a retro hilar mass in the superior segment of the right lower lobe.  Remaining lungs clear.  No pleural effusion or pneumothorax.  Mild elevation of right  diaphragm noted.  Fractures seen at lateral right 8th rib and posterolateral right 10th rib.  IMPRESSION: Suspected pulmonary mass in the superior segment of the right lower lobe; CT chest with contrast recommended for further evaluation.  Fractures of the right 8th and 10th ribs as above.  Findings called to Mellody Drown  on 06/28/2013 at 1350 hr.   Electronically Signed   By: Ulyses Southward M.D.   On: 06/28/2013 13:50   Ct Chest W Contrast  06/28/2013   ADDENDUM REPORT: 06/28/2013 16:26  ADDENDUM: Minimally displaced fracture of of the right 8th and 10th rib.   Electronically Signed   By: Bridgett Larsson M.D.   On: 06/28/2013 16:26   06/28/2013   CLINICAL DATA:  Shortness of breath. Abdominal pain. Distention. Ascites. History of appendectomy.  EXAM: CT CHEST, ABDOMEN, AND PELVIS  WITH CONTRAST  TECHNIQUE: Multidetector CT imaging of the chest, abdomen and pelvis was performed following the standard protocol during bolus administration of intravenous contrast.  CONTRAST:  50mL OMNIPAQUE IOHEXOL 300 MG/ML SOLN, OMNIPAQUE IOHEXOL 300 MG/ML SOLN  COMPARISON:  03/24/2012 CT abdomen and pelvis. 06/28/2013 chest x-ray.  FINDINGS: CT CHEST  Consolidation superior medial segment of the right lower lobe surrounding bronchi and vessels. Etiology indeterminate. This may represent infectious process. Recommend treating any clinically suspected infectious infiltrate aggressively with Davonne Baby term followup chest CT to exclude the possibility of malignancy. The bronchi adjacent to this region are patent. Vessels extending into this region are patent without central pulmonary embolus noted.  Hazy parenchymal changes left upper lobe may represent pneumonia.  Degenerative changes lower cervical and thoracic spine without bony destructive lesion.  CT ABDOMEN AND PELVIS  Cirrhotic liver. Splenomegaly. Prominent varices. Prominent ascites. The portal vein and splenic vein remain patent. No hyperenhancing lesion of the liver.   Changes of chronic pancreatitis.  Peri umbilical hernia contains fluid and fat. Fluid extends into the inguinal canal greater on the right.  Limited for detecting bowel inflammatory process or cholecystitis given the ascites. Small calcified gallstones are noted. No free intraperitoneal air.  Small bowel loops are fluid and contrast filled and slightly prominent measuring up to 3.1 cm without point of obstruction noted.  Scattered renal lesions some of which are cysts and others too small to characterize. No adrenal lesion.  Atherosclerotic type changes of the aorta without aneurysmal dilation.  Decompressed non contrast filled views of the urinary bladder without gross abnormality.  Coarse calcifications of the prostate gland.  IMPRESSION: Consolidation superior medial segment of the right lower lobe. Etiology indeterminate. This may represent infectious process. Recommend treating any clinically suspected infectious infiltrate aggressively with Camreigh Michie term followup chest CT to exclude the possibility of malignancy.  Hazy parenchymal changes left upper lobe may represent pneumonia.  Cirrhotic liver. Splenomegaly. Prominent varices. Prominent ascites. The portal vein and splenic vein remain patent.  Changes of chronic pancreatitis.  Peri umbilical hernia contains fluid and fat. Fluid extends into the inguinal canal greater on the right.  Limited for detecting bowel inflammatory process or cholecystitis given the ascites. Small calcified gallstones are noted. No free intraperitoneal air.  Small bowel loops are fluid and contrast filled and slightly prominent measuring up to 3.1 cm without point of obstruction noted.  Gastric folds appear slightly thickened which may be related to under distension although gastritis not excluded.  Scattered renal lesions some of which are cysts and others too small to characterize.  Atherosclerotic type changes of the aorta without aneurysmal dilation.  Electronically Signed: By: Bridgett Larsson M.D. On: 06/28/2013 15:37   Ct Abdomen Pelvis W Contrast  06/28/2013   ADDENDUM REPORT: 06/28/2013 16:26  ADDENDUM: Minimally displaced fracture of of the right 8th and 10th rib.   Electronically Signed   By: Bridgett Larsson M.D.   On: 06/28/2013 16:26  06/28/2013   CLINICAL DATA:  Shortness of breath. Abdominal pain. Distention. Ascites. History of appendectomy.  EXAM: CT CHEST, ABDOMEN, AND PELVIS  WITH CONTRAST  TECHNIQUE: Multidetector CT imaging of the chest, abdomen and pelvis was performed following the standard protocol during bolus administration of intravenous contrast.  CONTRAST:  50mL OMNIPAQUE IOHEXOL 300 MG/ML SOLN, OMNIPAQUE IOHEXOL 300 MG/ML SOLN  COMPARISON:  03/24/2012 CT abdomen and pelvis. 06/28/2013 chest x-ray.  FINDINGS: CT CHEST  Consolidation superior medial segment of the right lower lobe surrounding bronchi and vessels. Etiology indeterminate. This may represent infectious process. Recommend treating any clinically suspected infectious infiltrate aggressively with Jarett Dralle term followup chest CT to exclude the possibility of malignancy. The bronchi adjacent to this region are patent. Vessels extending into this region are patent without central pulmonary embolus noted.  Hazy parenchymal changes left upper lobe may represent pneumonia.  Degenerative changes lower cervical and thoracic spine without bony destructive lesion.  CT ABDOMEN AND PELVIS  Cirrhotic liver. Splenomegaly. Prominent varices. Prominent ascites. The portal vein and splenic vein remain patent. No hyperenhancing lesion of the liver.  Changes of chronic pancreatitis.  Peri umbilical hernia contains fluid and fat. Fluid extends into the inguinal canal greater on the right.  Limited for detecting bowel inflammatory process or cholecystitis given the ascites. Small calcified gallstones are noted. No free intraperitoneal air.  Small bowel loops are fluid and contrast filled and slightly prominent measuring up to  3.1 cm without point of obstruction noted.  Scattered renal lesions some of which are cysts and others too small to characterize. No adrenal lesion.  Atherosclerotic type changes of the aorta without aneurysmal dilation.  Decompressed non contrast filled views of the urinary bladder without gross abnormality.  Coarse calcifications of the prostate gland.  IMPRESSION: Consolidation superior medial segment of the right lower lobe. Etiology indeterminate. This may represent infectious process. Recommend treating any clinically suspected infectious infiltrate aggressively with Jonahtan Manseau term followup chest CT to exclude the possibility of malignancy.  Hazy parenchymal changes left upper lobe may represent pneumonia.  Cirrhotic liver. Splenomegaly. Prominent varices. Prominent ascites. The portal vein and splenic vein remain patent.  Changes of chronic pancreatitis.  Peri umbilical hernia contains fluid and fat. Fluid extends into the inguinal canal greater on the right.  Limited for detecting bowel inflammatory process or cholecystitis given the ascites. Small calcified gallstones are noted. No free intraperitoneal air.  Small bowel loops are fluid and contrast filled and slightly prominent measuring up to 3.1 cm without point of obstruction noted.  Gastric folds appear slightly thickened which may be related to under distension although gastritis not excluded.  Scattered renal lesions some of which are cysts and others too small to characterize.  Atherosclerotic type changes of the aorta without aneurysmal dilation.  Electronically Signed: By: Bridgett Larsson M.D. On: 06/28/2013 15:37   US Paracentesis  06/29/2013   CLINICAL DATA:  Alcoholic cirrhosis, ascites, abdominal distention  EXAM: ULTRASOUND GUIDED PARACENTESIS  COMPARISON:  None.  PROCEDURE: An ultrasound guided paracentesis was thoroughly discussed with the patient and questions answered. The benefits, risks, alternatives and complications were also discussed.  The patient understands and wishes to proceed with the procedure. Written consent was obtained.  Ultrasound was performed to localize and mark an adequate pocket of fluid in the right lower quadrant of the abdomen. The area was then prepped and draped in the normal sterile fashion. 1% Lidocaine was used for local anesthesia. Under ultrasound guidance a 19 gauge Yueh catheter was  introduced. Paracentesis was performed. The catheter was removed and a dressing applied.  COMPLICATIONS: None immediate  FINDINGS: A total of approximately 6 L of dark yellow fluid was removed. A fluid sample was sent for laboratory analysis.  IMPRESSION: Successful ultrasound guided paracentesis yielding 6 L of ascites.  Read by: Brayton El PA-C   Electronically Signed   By: Irish Lack M.D.   On: 06/29/2013 15:24    Scheduled Meds: . folic acid  1 mg Oral Daily  . furosemide  40 mg Intravenous Daily  . lipase/protease/amylase  1 capsule Oral q1800  . multivitamin with minerals  1 tablet Oral Daily  . pantoprazole  40 mg Oral Daily  . piperacillin-tazobactam (ZOSYN)  IV  3.375 g Intravenous Q8H  . potassium chloride  40 mEq Oral BID  . propranolol  10 mg Oral BID  . rifaximin  550 mg Oral BID  . sertraline  100 mg Oral Daily  . sodium chloride  3 mL Intravenous Q12H  . spironolactone  50 mg Oral BID  . thiamine  100 mg Oral Daily   Or  . thiamine  100 mg Intravenous Daily   Continuous Infusions:   Principal Problem:   Ascites Active Problems:   Thrombocytopenia, unspecified   Alcohol abuse   Liver disease   Hyponatremia   Esophageal varices   Normocytic anemia   Hypokalemia    Time spent: 30 min    Taletha Twiford, [facility]  [facility] Pager [phone]. If 7PM-7AM, please contact night-coverage at [email or web address], password [facility] 06/30/2013, 1:08 PM  LOS: 2 days

## 2013-07-01 DIAGNOSIS — J690 Pneumonitis due to inhalation of food and vomit: Secondary | ICD-10-CM

## 2013-07-01 LAB — COMPREHENSIVE METABOLIC PANEL
AST: 93 U/L — ABNORMAL HIGH (ref 0–37)
Albumin: 2.6 g/dL — ABNORMAL LOW (ref 3.5–5.2)
Calcium: 8.2 mg/dL — ABNORMAL LOW (ref 8.4–10.5)
Chloride: 94 mEq/L — ABNORMAL LOW (ref 96–112)
Creatinine, Ser: 0.89 mg/dL (ref 0.50–1.35)
Total Protein: 6.8 g/dL (ref 6.0–8.3)

## 2013-07-01 LAB — CBC
HCT: 32.6 % — ABNORMAL LOW (ref 39.0–52.0)
Hemoglobin: 11.3 g/dL — ABNORMAL LOW (ref 13.0–17.0)
MCH: 34 pg (ref 26.0–34.0)
MCV: 98.2 fL (ref 78.0–100.0)
RBC: 3.32 MIL/uL — ABNORMAL LOW (ref 4.22–5.81)

## 2013-07-01 MED ORDER — POTASSIUM CHLORIDE CRYS ER 20 MEQ PO TBCR: 40.0000 meq | EXTENDED_RELEASE_TABLET | Freq: Every day | ORAL | Status: AC

## 2013-07-01 MED ORDER — FOLIC ACID 1 MG PO TABS: 1.0000 mg | ORAL_TABLET | Freq: Every day | ORAL | Status: DC

## 2013-07-01 MED ORDER — LACTULOSE 10 GM/15ML PO SOLN: 30.0000 g | Freq: Every day | ORAL | Status: AC

## 2013-07-01 MED ORDER — PROPRANOLOL HCL 10 MG PO TABS: 10.0000 mg | ORAL_TABLET | Freq: Two times a day (BID) | ORAL | Status: AC

## 2013-07-01 MED ORDER — RIFAXIMIN 550 MG PO TABS: 550.0000 mg | ORAL_TABLET | Freq: Two times a day (BID) | ORAL | Status: AC

## 2013-07-01 MED ORDER — TAMSULOSIN HCL 0.4 MG PO CAPS: 0.4000 mg | ORAL_CAPSULE | Freq: Every day | ORAL | Status: AC

## 2013-07-01 MED ORDER — LACTULOSE 10 GM/15ML PO SOLN
30.0000 g | Freq: Every day | ORAL | Status: DC
  Administered 2013-07-01: 11:00:00 30 g via ORAL
  Filled 2013-07-01: qty 45

## 2013-07-01 MED ORDER — AMOXICILLIN-POT CLAVULANATE 875-125 MG PO TABS: 1.0000 | ORAL_TABLET | Freq: Two times a day (BID) | ORAL | Status: DC

## 2013-07-01 MED ORDER — THIAMINE HCL 100 MG PO TABS: 100.0000 mg | ORAL_TABLET | Freq: Every day | ORAL | Status: AC

## 2013-07-01 NOTE — Progress Notes (Signed)
Pt discharged home via family; Pt and family given and explained all discharge instructions, carenotes, and prescriptions; pt and family stated understanding and denied questions/concerns; all f/u appointments in place; IV removed without complicaitons; pt stable at time of discharge   Detailed instructions and education given to patient and mother, Toney Reil; both stated understanding. Extensive education given regarding alcohol cessation and counseling. Stated understanding

## 2013-07-01 NOTE — Discharge Summary (Addendum)
Physician Discharge Summary  Alexander Burch WUJ:[id number] DOB: [birth year] DOA: 06/28/2013  PCP: Warrick Parisian, MD  Admit date: 06/28/2013 Discharge date: 07/01/2013  Recommendations for Outpatient Follow-up:  1. Follow up with your primary care doctor in 1-2 weeks for reevaluation of pneumonia and referral for repeat CT chest.  Please discuss urinary retention.  Check BMP for creatinine and potassium.  Consider reducing zoloft due to hyponatremia if the risks are felt to outweigh benefits.   2. Follow up with Dr. Loreta Ave in 2 weeks for review of new medications and ascites  Discharge Diagnoses:  Principal Problem:   Ascites Active Problems:   Thrombocytopenia, unspecified   Alcohol abuse   Liver disease   Hyponatremia   Esophageal varices   Normocytic anemia   Hypokalemia   Hepatic encephalopathy   Aspiration pneumonia   Discharge Condition: stable, improved  Diet recommendation: Low sodium  Wt Readings from Last 3 Encounters:  06/28/13 73.7 kg (162 lb 7.7 oz)  04/25/13 73.936 kg (163 lb)  03/28/13 72.576 kg (160 lb)    History of present illness:  With a hx of active ETOH abuse with thrombocytopenia and current w/u for liver disease. Pt presents with worsening abdominal girth and swelling. Pt had been continued on lasix with spironolactone prior to ED visit. In the ED, pt was noted to have cxr findings worrisome for a right sided "mass." F/u CT with findings suggestive of possible infection. Given worsening ascites, the hospitalist service was consulted for admission.  Hospital Course:  Ascites due to alcoholic cirrhosis s/p paracentesis 11/19.  He was treated with IV lasix 40mg  daily and he continued his home spironolactone dose.  He was given education about low sodium diet and counseled to avoid alcohol.  His abdominal distention dramatically decreased during admission.  His ascitic fluid was inconsistent with SBP (very few WBC) and his culture is negative.  6L were  removed so he received an albumin infusion of the date of the procedure (~6g/L removed).    EtOH cirrhosis with complications of ascites, varices, hyponatremia, thrombocytopenia with ongoing EtOH use, MELD [facility] 11, MELD UNOS 16.  He is followed by Dr. Loreta Ave, GI, who saw him repeatedly during this hospitalization.  Per her recommendations, he was started on propranolol.  An AFP was wnl.    Mild hepatic encephalopathy, Ammonia level 54.  Although awake and alert at some times, at other times, he was sleepy and exhibited some confusion.  He was started on lactulose daily and rifaximin was added per Dr. Kenna Gilbert request.  His mentation improved.  Prior authorization will be taken care of by Dr. Kenna Gilbert office.   ETOH abuse.  He remained in the hospital for 3 days and his CIWA scores trended down.  He was started on thiamine and folate and continued a MVI.  He will not be provided a prescription for ativan at the time of discharge.  He was given prescriptions for thiamine and folate.    Aspiration pneumonia suggested by CT chest, however, unable to rule out underlying malignancy.  He was started on zosyn and was transitioned to augmentin at the time of discharge to complete a 7-day course.  He will need a repeat Chest CT to evaluate a possible mass in a few weeks, after completion of antibiotics.    Chronic pancreatitis, stable. Continue creon. Lipase wnl.   Possible urinary retention, may have some BPH and he was given some narcotic medications for abdominal pain during his first two days of admission.  He  reported the sensation that his bladder was not emptying fully and urinary frequency.  PVR demonstrated > of retained urine.  Narcotics were discontinued, he was started on flomax, and a foley catheter was placed.  His catheter was discontinued on the day of discharge and he was able to void freely.  He should continue flomax for now and follow up with his PCP in 1-2 weeks to determine if the flomax  should be continued.  UA with only small LE and 0-2 WBC.  Hyponatremia, likely related to Cirrhosis, however, may also be side effect of zoloft.  Please start decreasing zoloft if risks are felt to outweigh benefits.  Defer to PCP.      Borderline macrocytic anemia, likely secondary to EtOH marrow suppression.  Encouraged EtOH cessation.  Thrombocytopenia followed by Dr. Myna Hidalgo, Oncology. 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    Constipation resolved after starting lactulose.  Hypokalemia, likely secondary to lasix and poor dietary intake.  Started daily potassium.  Hypomagnesemia, given several doses of IV magnesium sulfate.    Consultants:  Radiology for US paracentesis Procedures:  US guided paracentesis 11/19 Antibiotics:  Vancomycin 11/18 >>11/19  Zosyn 11/ 18 >> 11/21 to be followed by Augmentin  Discharge Exam: Filed Vitals:   07/01/13 0453  BP: 105/70  Pulse: 79  Temp: 98 F (36.7 C)  Resp: 17   Filed Vitals:   06/30/13 1354 06/30/13 1751 06/30/13 2015 07/01/13 0453  BP: 103/63 123/75 101/62 105/70  Pulse: 73   79  Temp: 97.9 F (36.6 C)  97.9 F (36.6 C) 98 F (36.7 C)  TempSrc: Axillary  Oral Oral  Resp: 16  20 17   Height:      Weight:      SpO2: 96%  95% 96%    General: Mildly jaundiced CM, No acute distress, asleep and easily arousable HEENT: NCAT, MMM, mild icterus, dry lips Cardiovascular: RRR, nl S1, S2 no mrg, 2+ pulses, warm extremities  Respiratory: CTAB, no increased WOB  Abdomen: NABS, soft, mildly distended, nontender to palpation  MSK: Normal tone and bulk, no LEE  Neuro: Grossly intact  Skin: Many spider angiomas and telangiectasias   Discharge Instructions      Discharge Orders   Future Appointments Provider Department Dept Phone   07/21/2013 1:45 PM Rachael Fee [facility] [phone]   07/21/2013 2:15 PM Josph Macho, MD [place]  [facility] [phone]   Future Orders Complete By Expires   Call MD for:  difficulty breathing, headache or visual disturbances  As directed    Call MD for:  extreme fatigue  As directed    Call MD for:  hives  As directed    Call MD for:  persistant dizziness or light-headedness  As directed    Call MD for:  persistant nausea and vomiting  As directed    Call MD for:  severe uncontrolled pain  As directed    Call MD for:  temperature >100.4  As directed    Diet - low sodium heart healthy  As directed    Discharge instructions  As directed    Comments:     You were hospitalized with a combination of pneumonia and ascites.  You had a paracentesis and received education about the importance of a low salt diet in preventing ascites.  Please continue to take your lasix and spironolactone.  Your potassium levels  were very low.  Please take potassium daily to prevent low potassium.  For your liver disease, you were started on three new medications, propranolol (for varices), lactulose (for hepatic encephalopathy and constipation), and rifaxamin (for hepatic encephalopathy).  Please talk to Dr. Loreta Ave about these medications.  You had problems with urinary retention and may have an enlarged prostate.  Please take flomax daily and follow up with your primary care doctor in 2 weeks.  Your urinary retention may decrease once you have not had narcotic medications for a while.   Driving Restrictions  As directed    Comments:     Until cleared by your primary care doctor.   Increase activity slowly  As directed        Medication List         amoxicillin-clavulanate 875-125 MG per tablet  Commonly known as:  AUGMENTIN  Take 1 tablet by mouth 2 (two) times daily.     folic acid 1 MG tablet  Commonly known as:  FOLVITE  Take 1 tablet (1 mg total) by mouth daily.     furosemide 20 MG tablet  Commonly known as:  LASIX  Take 20 mg by mouth 2 (two) times daily.     GAS-X EXTRA  STRENGTH PO  Take 1 tablet by mouth daily as needed (gas).     lactulose 10 GM/15ML solution  Commonly known as:  CHRONULAC  Take 45 mLs (30 g total) by mouth daily.     multivitamin tablet  Take 1 tablet by mouth daily.     potassium chloride SA 20 MEQ tablet  Commonly known as:  K-DUR,KLOR-CON  Take 2 tablets (40 mEq total) by mouth daily.     promethazine 25 MG tablet  Commonly known as:  PHENERGAN  Take 25 mg by mouth every 8 (eight) hours as needed for nausea.     propranolol 10 MG tablet  Commonly known as:  INDERAL  Take 1 tablet (10 mg total) by mouth 2 (two) times daily.     RABEprazole 20 MG tablet  Commonly known as:  ACIPHEX  Take 20 mg by mouth daily.     rifaximin 550 MG Tabs tablet  Commonly known as:  XIFAXAN  Take 1 tablet (550 mg total) by mouth 2 (two) times daily.     sertraline 100 MG tablet  Commonly known as:  ZOLOFT  Take 100 mg by mouth daily.     spironolactone 50 MG tablet  Commonly known as:  ALDACTONE  Take 50 mg by mouth 2 (two) times daily.     tamsulosin 0.4 MG Caps capsule  Commonly known as:  FLOMAX  Take 1 capsule (0.4 mg total) by mouth daily after supper.     thiamine 100 MG tablet  Take 1 tablet (100 mg total) by mouth daily.     ZENPEP 20000 UNITS Cpep  Generic drug:  Pancrelipase (Lip-Prot-Amyl)  Take 1 capsule by mouth every morning.       Follow-up Information   Follow up with Warrick Parisian, MD. Schedule an appointment as soon as possible for a visit in 2 weeks.   Specialty:  Family Medicine   Contact information:   [address] [place] [address] [phone]       Follow up with MANN,JYOTHI, MD. Schedule an appointment as soon as possible for a visit in 2 weeks.   Specialty:  Gastroenterology   Contact information:   [address] [place] [address]  [phone]        The results of significant diagnostics from this hospitalization (including imaging, microbiology,  ancillary and laboratory) are listed below for reference.    Significant Diagnostic Studies: Dg Chest 2 View  06/28/2013   CLINICAL DATA:  Right side lower posterior chest and back pain, smoker, ascites, cirrhosis  EXAM: CHEST  2 VIEW  COMPARISON:  None  FINDINGS: Normal heart size.  Abnormal soft tissue density at right hilum on frontal view corresponds to density posteriorly on the lateral view likely a retro hilar mass in the superior segment of the right lower lobe.  Remaining lungs clear.  No pleural effusion or pneumothorax.  Mild elevation of right diaphragm noted.  Fractures seen at lateral right 8th rib and posterolateral right 10th rib.  IMPRESSION: Suspected pulmonary mass in the superior segment of the right lower lobe; CT chest with contrast recommended for further evaluation.  Fractures of the right 8th and 10th ribs as above.  Findings called to Mellody Drown on 06/28/2013 at 1350 hr.   Electronically Signed   By: Ulyses Southward M.D.   On: 06/28/2013 13:50   Ct Chest W Contrast  06/28/2013   ADDENDUM REPORT: 06/28/2013 16:26  ADDENDUM: Minimally displaced fracture of of the right 8th and 10th rib.   Electronically Signed   By: Bridgett Larsson M.D.   On: 06/28/2013 16:26   06/28/2013   CLINICAL DATA:  Shortness of breath. Abdominal pain. Distention. Ascites. History of appendectomy.  EXAM: CT CHEST, ABDOMEN, AND PELVIS  WITH CONTRAST  TECHNIQUE: Multidetector CT imaging of the chest, abdomen and pelvis was performed following the standard protocol during bolus administration of intravenous contrast.  CONTRAST:  50mL OMNIPAQUE IOHEXOL 300 MG/ML SOLN, OMNIPAQUE IOHEXOL 300 MG/ML SOLN  COMPARISON:  03/24/2012 CT abdomen and pelvis. 06/28/2013 chest x-ray.  FINDINGS: CT CHEST  Consolidation superior medial segment of the right lower lobe surrounding bronchi and vessels. Etiology indeterminate. This may represent infectious process. Recommend treating any clinically suspected infectious  infiltrate aggressively with Brenleigh Collet term followup chest CT to exclude the possibility of malignancy. The bronchi adjacent to this region are patent. Vessels extending into this region are patent without central pulmonary embolus noted.  Hazy parenchymal changes left upper lobe may represent pneumonia.  Degenerative changes lower cervical and thoracic spine without bony destructive lesion.  CT ABDOMEN AND PELVIS  Cirrhotic liver. Splenomegaly. Prominent varices. Prominent ascites. The portal vein and splenic vein remain patent. No hyperenhancing lesion of the liver.  Changes of chronic pancreatitis.  Peri umbilical hernia contains fluid and fat. Fluid extends into the inguinal canal greater on the right.  Limited for detecting bowel inflammatory process or cholecystitis given the ascites. Small calcified gallstones are noted. No free intraperitoneal air.  Small bowel loops are fluid and contrast filled and slightly prominent measuring up to 3.1 cm without point of obstruction noted.  Scattered renal lesions some of which are cysts and others too small to characterize. No adrenal lesion.  Atherosclerotic type changes of the aorta without aneurysmal dilation.  Decompressed non contrast filled views of the urinary bladder without gross abnormality.  Coarse calcifications of the prostate gland.  IMPRESSION: Consolidation superior medial segment of the right lower lobe. Etiology indeterminate. This may represent infectious process. Recommend treating any clinically suspected infectious infiltrate aggressively with Geena Weinhold term followup chest CT to exclude the possibility of malignancy.  Hazy parenchymal changes left upper lobe may represent pneumonia.  Cirrhotic liver. Splenomegaly. Prominent varices. Prominent ascites. The  portal vein and splenic vein remain patent.  Changes of chronic pancreatitis.  Peri umbilical hernia contains fluid and fat. Fluid extends into the inguinal canal greater on the right.  Limited for  detecting bowel inflammatory process or cholecystitis given the ascites. Small calcified gallstones are noted. No free intraperitoneal air.  Small bowel loops are fluid and contrast filled and slightly prominent measuring up to 3.1 cm without point of obstruction noted.  Gastric folds appear slightly thickened which may be related to under distension although gastritis not excluded.  Scattered renal lesions some of which are cysts and others too small to characterize.  Atherosclerotic type changes of the aorta without aneurysmal dilation.  Electronically Signed: By: Bridgett Larsson M.D. On: 06/28/2013 15:37   Ct Abdomen Pelvis W Contrast  06/28/2013   ADDENDUM REPORT: 06/28/2013 16:26  ADDENDUM: Minimally displaced fracture of of the right 8th and 10th rib.   Electronically Signed   By: Bridgett Larsson M.D.   On: 06/28/2013 16:26   06/28/2013   CLINICAL DATA:  Shortness of breath. Abdominal pain. Distention. Ascites. History of appendectomy.  EXAM: CT CHEST, ABDOMEN, AND PELVIS  WITH CONTRAST  TECHNIQUE: Multidetector CT imaging of the chest, abdomen and pelvis was performed following the standard protocol during bolus administration of intravenous contrast.  CONTRAST:  50mL OMNIPAQUE IOHEXOL 300 MG/ML SOLN, OMNIPAQUE IOHEXOL 300 MG/ML SOLN  COMPARISON:  03/24/2012 CT abdomen and pelvis. 06/28/2013 chest x-ray.  FINDINGS: CT CHEST  Consolidation superior medial segment of the right lower lobe surrounding bronchi and vessels. Etiology indeterminate. This may represent infectious process. Recommend treating any clinically suspected infectious infiltrate aggressively with Richele Strand term followup chest CT to exclude the possibility of malignancy. The bronchi adjacent to this region are patent. Vessels extending into this region are patent without central pulmonary embolus noted.  Hazy parenchymal changes left upper lobe may represent pneumonia.  Degenerative changes lower cervical and thoracic spine without bony  destructive lesion.  CT ABDOMEN AND PELVIS  Cirrhotic liver. Splenomegaly. Prominent varices. Prominent ascites. The portal vein and splenic vein remain patent. No hyperenhancing lesion of the liver.  Changes of chronic pancreatitis.  Peri umbilical hernia contains fluid and fat. Fluid extends into the inguinal canal greater on the right.  Limited for detecting bowel inflammatory process or cholecystitis given the ascites. Small calcified gallstones are noted. No free intraperitoneal air.  Small bowel loops are fluid and contrast filled and slightly prominent measuring up to 3.1 cm without point of obstruction noted.  Scattered renal lesions some of which are cysts and others too small to characterize. No adrenal lesion.  Atherosclerotic type changes of the aorta without aneurysmal dilation.  Decompressed non contrast filled views of the urinary bladder without gross abnormality.  Coarse calcifications of the prostate gland.  IMPRESSION: Consolidation superior medial segment of the right lower lobe. Etiology indeterminate. This may represent infectious process. Recommend treating any clinically suspected infectious infiltrate aggressively with Tulio Facundo term followup chest CT to exclude the possibility of malignancy.  Hazy parenchymal changes left upper lobe may represent pneumonia.  Cirrhotic liver. Splenomegaly. Prominent varices. Prominent ascites. The portal vein and splenic vein remain patent.  Changes of chronic pancreatitis.  Peri umbilical hernia contains fluid and fat. Fluid extends into the inguinal canal greater on the right.  Limited for detecting bowel inflammatory process or cholecystitis given the ascites. Small calcified gallstones are noted. No free intraperitoneal air.  Small bowel loops are fluid and contrast filled and slightly prominent measuring up to  3.1 cm without point of obstruction noted.  Gastric folds appear slightly thickened which may be related to under distension although gastritis not  excluded.  Scattered renal lesions some of which are cysts and others too small to characterize.  Atherosclerotic type changes of the aorta without aneurysmal dilation.  Electronically Signed: By: Bridgett Larsson M.D. On: 06/28/2013 15:37   US Paracentesis  06/29/2013   CLINICAL DATA:  Alcoholic cirrhosis, ascites, abdominal distention  EXAM: ULTRASOUND GUIDED PARACENTESIS  COMPARISON:  None.  PROCEDURE: An ultrasound guided paracentesis was thoroughly discussed with the patient and questions answered. The benefits, risks, alternatives and complications were also discussed. The patient understands and wishes to proceed with the procedure. Written consent was obtained.  Ultrasound was performed to localize and mark an adequate pocket of fluid in the right lower quadrant of the abdomen. The area was then prepped and draped in the normal sterile fashion. 1% Lidocaine was used for local anesthesia. Under ultrasound guidance a 19 gauge Yueh catheter was introduced. Paracentesis was performed. The catheter was removed and a dressing applied.  COMPLICATIONS: None immediate  FINDINGS: A total of approximately 6 L of dark yellow fluid was removed. A fluid sample was sent for laboratory analysis.  IMPRESSION: Successful ultrasound guided paracentesis yielding 6 L of ascites.  Read by: Brayton El PA-C   Electronically Signed   By: Irish Lack M.D.   On: 06/29/2013 15:24    Microbiology: Recent Results (from the past 240 hour(s))  BODY FLUID CULTURE     Status: None   Collection Time    06/29/13  1:45 PM      Result Value Range Status   Specimen Description ASCITIC   Final   Special Requests NONE   Final   Gram Stain     Final   Value: NO WBC SEEN     NO ORGANISMS SEEN     Performed at [facility]   Culture     Final   Value: NO GROWTH 1 DAY     Performed at [facility]   Report Status PENDING   Incomplete     Labs: Basic Metabolic Panel:  Recent Labs Lab 06/28/13 1215  06/29/13 0517 06/29/13 1330 06/30/13 0525 07/01/13 0418  NA 128* 124*  --  128* 126*  K 3.8 3.2*  --  3.1* 3.5  CL 94* 93*  --  94* 94*  CO2 23 24  --  23 21  GLUCOSE 101* 120*  --  102* 120*  BUN 5* 9  --  10 14  CREATININE 0.59 0.62  --  0.67 0.89  CALCIUM 8.8 8.0*  --  7.8* 8.2*  MG  --   --  1.5  --   --    Liver Function Tests:  Recent Labs Lab 06/28/13 1215 06/29/13 0517 06/30/13 0525 07/01/13 0418  AST 79* 60* 68* 93*  ALT 26 20 18 24   ALKPHOS 307* 255* 206* 215*  BILITOT 3.6* 3.3* 4.1* 3.1*  PROT 8.3 7.1 6.6 6.8  ALBUMIN 2.7* 2.4* 2.6* 2.6*    Recent Labs Lab 06/29/13 1330  LIPASE 12    Recent Labs Lab 06/29/13 1505  AMMONIA 54   CBC:  Recent Labs Lab 06/28/13 1215 06/29/13 0517 06/30/13 0525 07/01/13 0418  WBC 8.7 9.3 10.2 11.1*  NEUTROABS 6.5  --   --   --   HGB 12.5* 11.1* 11.0* 11.3*  HCT 36.7* 31.9* 31.9* 32.6*  MCV 100.0 99.1 98.8 98.2  PLT 128* 115* 108* 119*   Cardiac Enzymes: No results found for this basename: CKTOTAL, CKMB, CKMBINDEX, TROPONINI,  in the last 168 hours BNP: BNP (last 3 results) No results found for this basename: PROBNP,  in the last 8760 hours CBG: No results found for this basename: GLUCAP,  in the last 168 hours  Time coordinating discharge: 45 minutes  Signed:  Captain Blucher  [facility] 07/01/2013, 9:18 AM

## 2013-07-02 LAB — BODY FLUID CULTURE

## 2013-07-13 ENCOUNTER — Other Ambulatory Visit (HOSPITAL_COMMUNITY): Payer: Self-pay | Admitting: Family Medicine

## 2013-07-13 DIAGNOSIS — R918 Other nonspecific abnormal finding of lung field: Secondary | ICD-10-CM

## 2013-07-15 ENCOUNTER — Ambulatory Visit (HOSPITAL_COMMUNITY)
Admission: RE | Admit: 2013-07-15 | Discharge: 2013-07-15 | Disposition: A | Discharge status: Home / Self Care | Payer: BC Managed Care – PPO | Source: Ambulatory Visit | Attending: Family Medicine | Admitting: Family Medicine

## 2013-07-15 DIAGNOSIS — R0789 Other chest pain: Secondary | ICD-10-CM | POA: Insufficient documentation

## 2013-07-15 DIAGNOSIS — R918 Other nonspecific abnormal finding of lung field: Secondary | ICD-10-CM

## 2013-07-15 DIAGNOSIS — S2249XA Multiple fractures of ribs, unspecified side, initial encounter for closed fracture: Secondary | ICD-10-CM | POA: Insufficient documentation

## 2013-07-15 DIAGNOSIS — K8689 Other specified diseases of pancreas: Secondary | ICD-10-CM | POA: Insufficient documentation

## 2013-07-15 DIAGNOSIS — R161 Splenomegaly, not elsewhere classified: Secondary | ICD-10-CM | POA: Insufficient documentation

## 2013-07-15 DIAGNOSIS — R188 Other ascites: Secondary | ICD-10-CM | POA: Insufficient documentation

## 2013-07-15 DIAGNOSIS — N289 Disorder of kidney and ureter, unspecified: Secondary | ICD-10-CM | POA: Insufficient documentation

## 2013-07-15 DIAGNOSIS — R599 Enlarged lymph nodes, unspecified: Secondary | ICD-10-CM | POA: Insufficient documentation

## 2013-07-15 DIAGNOSIS — I2510 Atherosclerotic heart disease of native coronary artery without angina pectoris: Secondary | ICD-10-CM | POA: Insufficient documentation

## 2013-07-15 DIAGNOSIS — X58XXXA Exposure to other specified factors, initial encounter: Secondary | ICD-10-CM | POA: Insufficient documentation

## 2013-07-15 DIAGNOSIS — R0602 Shortness of breath: Secondary | ICD-10-CM | POA: Insufficient documentation

## 2013-07-15 DIAGNOSIS — I700 Atherosclerosis of aorta: Secondary | ICD-10-CM | POA: Insufficient documentation

## 2013-07-15 MED ORDER — IOHEXOL 300 MG/ML  SOLN
80.0000 mL | Freq: Once | INTRAMUSCULAR | Status: AC | PRN
  Administered 2013-07-15: 80 mL via INTRAVENOUS

## 2013-07-17 NOTE — Progress Notes (Signed)
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  CURRENT THERAPY:  Observation.  INTERIM HISTORY:  Alexander Burch comes in for his followup.  We saw him back in August.  At that point in time, we went ahead and did some routine lab work on him.  All of the tests came back pretty much unremarkable. His blood smear certainly did not show anything that looked suspicious for any hematologic malignancy.  He now has more abdominal distention.  I think he may actually have ascites.  I told him that Dr. Loreta Ave probably would need to see him for this.  He has had no bleeding.  There has been some bruising.  He states that he is trying to cut back on alcohol use.  He has had no cough.  He has had no leg swelling.  He has had no fever, sweats, or chills.  There has been no melena or bright red blood per rectum.  PHYSICAL EXAMINATION:  General:  This is a thin white gentleman in no obvious distress.  Vital Signs:  Temperature of 98.4, pulse 88, respiratory rate 18, blood pressure 126/73, weight is 163.  Head and Neck:  Normocephalic, atraumatic skull.  There are no ocular or oral lesions.  He has no scleral icterus.  There is no intraoral petechia. Neck:  Supple with no adenopathy.  Thyroid is not palpable.  Lungs: Clear bilaterally.  There may be some slight decrease at the bases. Cardiac:  Regular rate and rhythm with a normal S1 and S2.  He has no murmurs, rubs, or bruits.  Abdomen:  The abdomen is somewhat distended. There is a fluid wave.  He has an umbilical hernia that reduces.  There is no guarding or rebound tenderness.  He does have some variceal veins on his anterior abdominal wall.  There is no palpable hepatomegaly. Spleen tip may be palpable with deep inspiration.  Extremities:  Muscle atrophy in upper and lower extremities.  He has decent range of motion of his joints.  He has good  strength in his upper and lower extremities. Skin:  Skin exam does show some scattered ecchymoses.  Neurologic:  No focal neurological deficits.  LABORATORY STUDIES:  White cell count of 8.5, hemoglobin 12.5, hematocrit 37.3, platelet count 68,000.  MCV is 100.  Peripheral smear, which I reviewed, shows normochromic normocytic population of red blood cells.  There may be some spherocytes.  I saw no target cells.  There is no rouleaux formation.  I do not see any schistocytes.  White cells appear normal in morphology and maturation. There were no hypersegmented polys.  He had no immature myeloid or lymphoid forms.  Platelets were decreased in number.  He had mostly smaller platelets.  Platelets were well granulated.  IMPRESSION:  Alexander Burch is a 52 year old gentleman with thrombocytopenia. He has heavy, heavy alcohol use.  Again, I still suspect that he likely has multifactorial reasons for the thrombocytopenia.  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  It looks like he may have ascites now.  I suspect that he also may have some marrow toxicity from alcohol use. The fact that the platelets appeared small in size, certainly would indicate decreased marrow production.  When I first saw him, I told him to take folic acid and thiamine.  I think he is doing this.  I do not believe this is hepatitis B/C or HIV.  He  does not have these diseases.  He is asymptomatic.  We will go ahead and plan to get him back in 2 months' time now.  I still believe that the less he drinks, the better his platelets will be and higher that they will stabilize.  I suspect that he always will have thrombocytopenia.  I spent a good 25 minutes with him today.    ______________________________ Josph Macho, M.D. PRE/MEDQ  D:  04/25/2013  T:  07/17/2013  Job:  [id number]

## 2013-07-19 ENCOUNTER — Telehealth: Payer: Self-pay | Admitting: Hematology & Oncology

## 2013-07-19 NOTE — Telephone Encounter (Signed)
Patient called and cx 07/21/13 apt and said he will call back to resch as needed

## 2013-07-21 ENCOUNTER — Other Ambulatory Visit: Payer: BC Managed Care – PPO | Admitting: Lab

## 2013-07-21 ENCOUNTER — Ambulatory Visit: Payer: BC Managed Care – PPO | Admitting: Hematology & Oncology

## 2013-08-17 ENCOUNTER — Ambulatory Visit (HOSPITAL_COMMUNITY)
Admission: RE | Admit: 2013-08-17 | Discharge: 2013-08-17 | Disposition: A | Discharge status: Home / Self Care | Payer: BC Managed Care – PPO | Source: Ambulatory Visit | Attending: Gastroenterology | Admitting: Gastroenterology

## 2013-08-17 ENCOUNTER — Other Ambulatory Visit: Payer: Self-pay | Admitting: Gastroenterology

## 2013-08-17 DIAGNOSIS — R143 Flatulence: Secondary | ICD-10-CM

## 2013-08-17 DIAGNOSIS — R188 Other ascites: Secondary | ICD-10-CM

## 2013-08-17 DIAGNOSIS — R141 Gas pain: Secondary | ICD-10-CM | POA: Insufficient documentation

## 2013-08-17 DIAGNOSIS — R142 Eructation: Secondary | ICD-10-CM | POA: Insufficient documentation

## 2013-08-17 MED ORDER — ALBUMIN HUMAN 25 % IV SOLN
25.0000 g | Freq: Once | INTRAVENOUS | Status: AC
  Administered 2013-08-17: 25 g via INTRAVENOUS
  Filled 2013-08-17: qty 100

## 2013-08-17 MED ORDER — ALBUMIN HUMAN 25 % IV SOLN
50.0000 g | Freq: Once | INTRAVENOUS | Status: AC
  Administered 2013-08-17: 50 g via INTRAVENOUS
  Filled 2013-08-17: qty 200

## 2013-08-17 NOTE — Procedures (Signed)
Successful US guided paracentesis from RLQ.  Yielded 9.2L of amber colored fluid.  No immediate complications.  Pt tolerated well.   Specimen was not sent for labs. He was given a total of 75g IV albumin  Yuriana Gaal PA-C 08/17/2013 2:13 PM

## 2013-08-25 ENCOUNTER — Ambulatory Visit
Admission: RE | Admit: 2013-08-25 | Discharge: 2013-08-25 | Disposition: A | Discharge status: Home / Self Care | Payer: BC Managed Care – PPO | Source: Ambulatory Visit | Attending: Gastroenterology | Admitting: Gastroenterology

## 2013-08-25 ENCOUNTER — Other Ambulatory Visit: Payer: Self-pay | Admitting: Gastroenterology

## 2013-08-25 DIAGNOSIS — R188 Other ascites: Secondary | ICD-10-CM

## 2013-08-25 DIAGNOSIS — J90 Pleural effusion, not elsewhere classified: Secondary | ICD-10-CM

## 2013-08-25 DIAGNOSIS — R0602 Shortness of breath: Secondary | ICD-10-CM

## 2013-08-31 ENCOUNTER — Emergency Department (HOSPITAL_COMMUNITY): Payer: BC Managed Care – PPO

## 2013-08-31 ENCOUNTER — Encounter (HOSPITAL_COMMUNITY): Payer: Self-pay | Admitting: Emergency Medicine

## 2013-08-31 ENCOUNTER — Inpatient Hospital Stay (HOSPITAL_COMMUNITY)
Admission: EM | Admit: 2013-08-31 | Discharge: 2013-09-02 | DRG: 871 | Disposition: A | Discharge status: Home / Self Care | Payer: BC Managed Care – PPO | Attending: Family Medicine | Admitting: Family Medicine

## 2013-08-31 DIAGNOSIS — E46 Unspecified protein-calorie malnutrition: Secondary | ICD-10-CM | POA: Diagnosis present

## 2013-08-31 DIAGNOSIS — E7220 Disorder of urea cycle metabolism, unspecified: Secondary | ICD-10-CM

## 2013-08-31 DIAGNOSIS — R188 Other ascites: Secondary | ICD-10-CM | POA: Diagnosis present

## 2013-08-31 DIAGNOSIS — R339 Retention of urine, unspecified: Secondary | ICD-10-CM | POA: Diagnosis present

## 2013-08-31 DIAGNOSIS — E869 Volume depletion, unspecified: Secondary | ICD-10-CM | POA: Diagnosis present

## 2013-08-31 DIAGNOSIS — J690 Pneumonitis due to inhalation of food and vomit: Secondary | ICD-10-CM | POA: Diagnosis present

## 2013-08-31 DIAGNOSIS — F1010 Alcohol abuse, uncomplicated: Secondary | ICD-10-CM

## 2013-08-31 DIAGNOSIS — F411 Generalized anxiety disorder: Secondary | ICD-10-CM | POA: Diagnosis present

## 2013-08-31 DIAGNOSIS — K861 Other chronic pancreatitis: Secondary | ICD-10-CM | POA: Diagnosis present

## 2013-08-31 DIAGNOSIS — J189 Pneumonia, unspecified organism: Secondary | ICD-10-CM

## 2013-08-31 DIAGNOSIS — K7290 Hepatic failure, unspecified without coma: Secondary | ICD-10-CM | POA: Diagnosis present

## 2013-08-31 DIAGNOSIS — E872 Acidosis, unspecified: Secondary | ICD-10-CM | POA: Diagnosis present

## 2013-08-31 DIAGNOSIS — IMO0002 Reserved for concepts with insufficient information to code with codable children: Secondary | ICD-10-CM

## 2013-08-31 DIAGNOSIS — K7682 Hepatic encephalopathy: Secondary | ICD-10-CM | POA: Diagnosis present

## 2013-08-31 DIAGNOSIS — Z79899 Other long term (current) drug therapy: Secondary | ICD-10-CM

## 2013-08-31 DIAGNOSIS — Z833 Family history of diabetes mellitus: Secondary | ICD-10-CM

## 2013-08-31 DIAGNOSIS — I214 Non-ST elevation (NSTEMI) myocardial infarction: Secondary | ICD-10-CM

## 2013-08-31 DIAGNOSIS — Z515 Encounter for palliative care: Secondary | ICD-10-CM

## 2013-08-31 DIAGNOSIS — K7200 Acute and subacute hepatic failure without coma: Secondary | ICD-10-CM

## 2013-08-31 DIAGNOSIS — I509 Heart failure, unspecified: Secondary | ICD-10-CM

## 2013-08-31 DIAGNOSIS — Z66 Do not resuscitate: Secondary | ICD-10-CM | POA: Diagnosis present

## 2013-08-31 DIAGNOSIS — D649 Anemia, unspecified: Secondary | ICD-10-CM | POA: Diagnosis present

## 2013-08-31 DIAGNOSIS — F17200 Nicotine dependence, unspecified, uncomplicated: Secondary | ICD-10-CM | POA: Diagnosis present

## 2013-08-31 DIAGNOSIS — K429 Umbilical hernia without obstruction or gangrene: Secondary | ICD-10-CM | POA: Diagnosis present

## 2013-08-31 DIAGNOSIS — R7989 Other specified abnormal findings of blood chemistry: Secondary | ICD-10-CM | POA: Diagnosis present

## 2013-08-31 DIAGNOSIS — R778 Other specified abnormalities of plasma proteins: Secondary | ICD-10-CM

## 2013-08-31 DIAGNOSIS — D696 Thrombocytopenia, unspecified: Secondary | ICD-10-CM | POA: Diagnosis present

## 2013-08-31 DIAGNOSIS — Z8701 Personal history of pneumonia (recurrent): Secondary | ICD-10-CM

## 2013-08-31 DIAGNOSIS — D72829 Elevated white blood cell count, unspecified: Secondary | ICD-10-CM | POA: Diagnosis present

## 2013-08-31 DIAGNOSIS — R0689 Other abnormalities of breathing: Secondary | ICD-10-CM

## 2013-08-31 DIAGNOSIS — R0600 Dyspnea, unspecified: Secondary | ICD-10-CM

## 2013-08-31 DIAGNOSIS — K7030 Alcoholic cirrhosis of liver without ascites: Secondary | ICD-10-CM | POA: Diagnosis present

## 2013-08-31 DIAGNOSIS — J9600 Acute respiratory failure, unspecified whether with hypoxia or hypercapnia: Secondary | ICD-10-CM | POA: Diagnosis present

## 2013-08-31 DIAGNOSIS — R6520 Severe sepsis without septic shock: Secondary | ICD-10-CM

## 2013-08-31 DIAGNOSIS — R799 Abnormal finding of blood chemistry, unspecified: Secondary | ICD-10-CM

## 2013-08-31 DIAGNOSIS — R748 Abnormal levels of other serum enzymes: Secondary | ICD-10-CM | POA: Diagnosis present

## 2013-08-31 DIAGNOSIS — K769 Liver disease, unspecified: Secondary | ICD-10-CM | POA: Diagnosis present

## 2013-08-31 DIAGNOSIS — A419 Sepsis, unspecified organism: Principal | ICD-10-CM | POA: Diagnosis present

## 2013-08-31 DIAGNOSIS — G8929 Other chronic pain: Secondary | ICD-10-CM | POA: Diagnosis present

## 2013-08-31 DIAGNOSIS — N179 Acute kidney failure, unspecified: Secondary | ICD-10-CM

## 2013-08-31 HISTORY — DX: Other chronic pancreatitis: K86.1

## 2013-08-31 HISTORY — DX: Other ascites: R18.8

## 2013-08-31 HISTORY — DX: Anemia, unspecified: D64.9

## 2013-08-31 HISTORY — DX: Esophageal varices without bleeding: I85.00

## 2013-08-31 HISTORY — DX: Hypo-osmolality and hyponatremia: E87.1

## 2013-08-31 HISTORY — DX: Hepatic failure, unspecified without coma: K72.90

## 2013-08-31 HISTORY — DX: Pneumonitis due to inhalation of food and vomit: J69.0

## 2013-08-31 HISTORY — DX: Hepatic encephalopathy: K76.82

## 2013-08-31 LAB — CBC WITH DIFFERENTIAL/PLATELET
Basophils Absolute: 0 10*3/uL (ref 0.0–0.1)
Basophils Relative: 0 % (ref 0–1)
Eosinophils Absolute: 0 10*3/uL (ref 0.0–0.7)
Eosinophils Relative: 0 % (ref 0–5)
HCT: 37.1 % — ABNORMAL LOW (ref 39.0–52.0)
Hemoglobin: 11.9 g/dL — ABNORMAL LOW (ref 13.0–17.0)
LYMPHS ABS: 1.5 10*3/uL (ref 0.7–4.0)
Lymphocytes Relative: 6 % — ABNORMAL LOW (ref 12–46)
MCH: 32.5 pg (ref 26.0–34.0)
MCHC: 32.1 g/dL (ref 30.0–36.0)
MCV: 101.4 fL — ABNORMAL HIGH (ref 78.0–100.0)
MONO ABS: 2.3 10*3/uL — AB (ref 0.1–1.0)
MONOS PCT: 9 % (ref 3–12)
NEUTROS PCT: 85 % — AB (ref 43–77)
Neutro Abs: 21.4 10*3/uL — ABNORMAL HIGH (ref 1.7–7.7)
PLATELETS: 314 10*3/uL (ref 150–400)
RBC: 3.66 MIL/uL — ABNORMAL LOW (ref 4.22–5.81)
RDW: 14 % (ref 11.5–15.5)
WBC: 25.2 10*3/uL — AB (ref 4.0–10.5)

## 2013-08-31 LAB — TROPONIN I: Troponin I: 0.74 ng/mL (ref <0.30)

## 2013-08-31 LAB — PROTIME-INR
INR: 4.49 — ABNORMAL HIGH (ref 0.00–1.49)
Prothrombin Time: 40.9 seconds — ABNORMAL HIGH (ref 11.6–15.2)

## 2013-08-31 LAB — URINALYSIS W MICROSCOPIC + REFLEX CULTURE
Glucose, UA: NEGATIVE mg/dL
Hgb urine dipstick: NEGATIVE
Ketones, ur: NEGATIVE mg/dL
NITRITE: NEGATIVE
PH: 5.5 (ref 5.0–8.0)
Protein, ur: NEGATIVE mg/dL
Specific Gravity, Urine: 1.023 (ref 1.005–1.030)
Urobilinogen, UA: 0.2 mg/dL (ref 0.0–1.0)

## 2013-08-31 LAB — AMMONIA: Ammonia: 190 umol/L — ABNORMAL HIGH (ref 11–60)

## 2013-08-31 LAB — COMPREHENSIVE METABOLIC PANEL
ALT: 62 U/L — ABNORMAL HIGH (ref 0–53)
AST: 217 U/L — ABNORMAL HIGH (ref 0–37)
Albumin: 2.3 g/dL — ABNORMAL LOW (ref 3.5–5.2)
Alkaline Phosphatase: 212 U/L — ABNORMAL HIGH (ref 39–117)
BUN: 27 mg/dL — AB (ref 6–23)
CO2: 14 mEq/L — ABNORMAL LOW (ref 19–32)
CREATININE: 1.98 mg/dL — AB (ref 0.50–1.35)
Calcium: 8.2 mg/dL — ABNORMAL LOW (ref 8.4–10.5)
Chloride: 98 mEq/L (ref 96–112)
GFR calc Af Amer: 43 mL/min — ABNORMAL LOW (ref >90)
GFR calc non Af Amer: 37 mL/min — ABNORMAL LOW (ref >90)
Glucose, Bld: 146 mg/dL — ABNORMAL HIGH (ref 70–99)
Potassium: 5 mEq/L (ref 3.7–5.3)
Sodium: 140 mEq/L (ref 137–147)
Total Bilirubin: 2.3 mg/dL — ABNORMAL HIGH (ref 0.3–1.2)
Total Protein: 6.4 g/dL (ref 6.0–8.3)

## 2013-08-31 LAB — GLUCOSE, CAPILLARY: GLUCOSE-CAPILLARY: 84 mg/dL (ref 70–99)

## 2013-08-31 LAB — LIPASE, BLOOD: LIPASE: 14 U/L (ref 11–59)

## 2013-08-31 LAB — PRO B NATRIURETIC PEPTIDE: PRO B NATRI PEPTIDE: 17349 pg/mL — AB (ref 0–125)

## 2013-08-31 MED ORDER — SODIUM CHLORIDE 0.9 % IV BOLUS (SEPSIS)
500.0000 mL | Freq: Once | INTRAVENOUS | Status: AC
  Administered 2013-08-31: 500 mL via INTRAVENOUS

## 2013-08-31 MED ORDER — SERTRALINE HCL 50 MG PO TABS: 100.0000 mg | ORAL_TABLET | Freq: Every day | ORAL | Status: DC

## 2013-08-31 MED ORDER — HYDROMORPHONE HCL PF 1 MG/ML IJ SOLN
0.5000 mg | INTRAMUSCULAR | Status: DC | PRN
  Administered 2013-08-31: 0.5 mg via INTRAVENOUS
  Administered 2013-09-01 (×2): 1 mg via INTRAVENOUS
  Administered 2013-09-01: 0.5 mg via INTRAVENOUS
  Administered 2013-09-01 (×3): 1 mg via INTRAVENOUS
  Administered 2013-09-01: 0.5 mg via INTRAVENOUS
  Administered 2013-09-01 (×2): 1 mg via INTRAVENOUS
  Filled 2013-08-31 (×10): qty 1

## 2013-08-31 MED ORDER — IPRATROPIUM BROMIDE 0.02 % IN SOLN
0.5000 mg | Freq: Once | RESPIRATORY_TRACT | Status: AC
  Administered 2013-08-31: 0.5 mg via RESPIRATORY_TRACT
  Filled 2013-08-31: qty 2.5

## 2013-08-31 MED ORDER — ALBUTEROL SULFATE (2.5 MG/3ML) 0.083% IN NEBU
5.0000 mg | INHALATION_SOLUTION | Freq: Once | RESPIRATORY_TRACT | Status: AC
  Administered 2013-08-31: 5 mg via RESPIRATORY_TRACT
  Filled 2013-08-31: qty 6

## 2013-08-31 MED ORDER — HYDROMORPHONE HCL 2 MG PO TABS: 2.0000 mg | ORAL_TABLET | ORAL | Status: DC | PRN

## 2013-08-31 MED ORDER — ONE-DAILY MULTI VITAMINS PO TABS: 1.0000 | ORAL_TABLET | Freq: Every day | ORAL | Status: DC

## 2013-08-31 MED ORDER — HEPARIN SODIUM (PORCINE) 5000 UNIT/ML IJ SOLN: 5000.0000 [IU] | Freq: Three times a day (TID) | INTRAMUSCULAR | Status: DC

## 2013-08-31 MED ORDER — CEFEPIME HCL 1 G IJ SOLR
1.0000 g | INTRAMUSCULAR | Status: AC
  Administered 2013-08-31: 1 g via INTRAVENOUS
  Filled 2013-08-31: qty 1

## 2013-08-31 MED ORDER — VANCOMYCIN HCL 10 G IV SOLR
1500.0000 mg | INTRAVENOUS | Status: AC
  Administered 2013-08-31: 1500 mg via INTRAVENOUS
  Filled 2013-08-31: qty 1500

## 2013-08-31 MED ORDER — PROPRANOLOL HCL 10 MG PO TABS: 10.0000 mg | ORAL_TABLET | Freq: Two times a day (BID) | ORAL | Status: DC

## 2013-08-31 MED ORDER — LACTULOSE 10 GM/15ML PO SOLN: 30.0000 g | Freq: Every day | ORAL | Status: DC

## 2013-08-31 MED ORDER — PANCRELIPASE (LIP-PROT-AMYL) 12000-38000 UNITS PO CPEP: 1.0000 | ORAL_CAPSULE | Freq: Three times a day (TID) | ORAL | Status: DC

## 2013-08-31 MED ORDER — HYDROMORPHONE HCL 2 MG PO TABS: 4.0000 mg | ORAL_TABLET | Freq: Two times a day (BID) | ORAL | Status: DC | PRN

## 2013-08-31 MED ORDER — FUROSEMIDE 20 MG PO TABS
20.0000 mg | ORAL_TABLET | Freq: Two times a day (BID) | ORAL | Status: DC
  Filled 2013-08-31 (×3): qty 1

## 2013-08-31 MED ORDER — VANCOMYCIN HCL IN DEXTROSE 750-5 MG/150ML-% IV SOLN
750.0000 mg | Freq: Two times a day (BID) | INTRAVENOUS | Status: DC
  Administered 2013-09-01: 750 mg via INTRAVENOUS
  Filled 2013-08-31 (×2): qty 150

## 2013-08-31 MED ORDER — PIPERACILLIN-TAZOBACTAM 3.375 G IVPB
3.3750 g | Freq: Three times a day (TID) | INTRAVENOUS | Status: DC
  Administered 2013-09-01: 3.375 g via INTRAVENOUS
  Filled 2013-08-31 (×4): qty 50

## 2013-08-31 MED ORDER — IPRATROPIUM BROMIDE 0.02 % IN SOLN
1.0000 mg | Freq: Once | RESPIRATORY_TRACT | Status: AC
  Administered 2013-08-31: 1 mg via RESPIRATORY_TRACT
  Filled 2013-08-31: qty 5

## 2013-08-31 MED ORDER — SPIRONOLACTONE 50 MG PO TABS
50.0000 mg | ORAL_TABLET | Freq: Every day | ORAL | Status: DC
  Filled 2013-08-31 (×2): qty 1

## 2013-08-31 MED ORDER — LEVALBUTEROL HCL 1.25 MG/0.5ML IN NEBU
1.2500 mg | INHALATION_SOLUTION | Freq: Four times a day (QID) | RESPIRATORY_TRACT | Status: DC
  Administered 2013-08-31 – 2013-09-01 (×5): 1.25 mg via RESPIRATORY_TRACT
  Filled 2013-08-31 (×10): qty 0.5

## 2013-08-31 MED ORDER — SODIUM CHLORIDE 0.9 % IV SOLN: INTRAVENOUS | Status: DC

## 2013-08-31 MED ORDER — KCL IN DEXTROSE-NACL 20-5-0.45 MEQ/L-%-% IV SOLN
INTRAVENOUS | Status: DC
  Filled 2013-08-31: qty 1000

## 2013-08-31 MED ORDER — HYDROMORPHONE HCL 2 MG PO TABS
2.0000 mg | ORAL_TABLET | ORAL | Status: DC | PRN
  Filled 2013-08-31: qty 1

## 2013-08-31 MED ORDER — VITAMIN B-1 100 MG PO TABS: 100.0000 mg | ORAL_TABLET | Freq: Every day | ORAL | Status: DC

## 2013-08-31 MED ORDER — HYDROMORPHONE HCL PF 2 MG/ML IJ SOLN
2.0000 mg | INTRAMUSCULAR | Status: DC | PRN
  Administered 2013-08-31: 2 mg via INTRAVENOUS
  Filled 2013-08-31 (×2): qty 1

## 2013-08-31 MED ORDER — FUROSEMIDE 10 MG/ML IJ SOLN
20.0000 mg | Freq: Once | INTRAMUSCULAR | Status: AC
  Administered 2013-08-31: 20 mg via INTRAVENOUS
  Filled 2013-08-31: qty 2

## 2013-08-31 MED ORDER — PANTOPRAZOLE SODIUM 40 MG PO TBEC: 40.0000 mg | DELAYED_RELEASE_TABLET | Freq: Every day | ORAL | Status: DC

## 2013-08-31 MED ORDER — TAMSULOSIN HCL 0.4 MG PO CAPS
0.4000 mg | ORAL_CAPSULE | Freq: Every day | ORAL | Status: DC
Start: 2013-09-01 — End: 2013-08-31

## 2013-08-31 MED ORDER — ALBUTEROL SULFATE (2.5 MG/3ML) 0.083% IN NEBU: 2.5000 mg | INHALATION_SOLUTION | RESPIRATORY_TRACT | Status: DC

## 2013-08-31 MED ORDER — ALBUTEROL (5 MG/ML) CONTINUOUS INHALATION SOLN
10.0000 mg/h | INHALATION_SOLUTION | Freq: Once | RESPIRATORY_TRACT | Status: AC
  Administered 2013-08-31: 10 mg/h via RESPIRATORY_TRACT
  Filled 2013-08-31: qty 20

## 2013-08-31 MED ORDER — RIFAXIMIN 550 MG PO TABS: 550.0000 mg | ORAL_TABLET | Freq: Two times a day (BID) | ORAL | Status: DC

## 2013-08-31 MED ORDER — FOLIC ACID 1 MG PO TABS
1.0000 mg | ORAL_TABLET | Freq: Every day | ORAL | Status: DC
  Filled 2013-08-31: qty 1

## 2013-08-31 NOTE — Progress Notes (Signed)
Patient ZO:XWRUEA:Alexander Burch      DOB: 03/08/1961      VWU:[id number]  New consult request noted.  Patient still in ED .  Will see an evaluated asap in the AM.  Carolie Mcilrath L. Ladona Ridgelaylor, MD MBA The Palliative Medicine Team at [facility] Team Phone: [phone] Pager: [phone]

## 2013-08-31 NOTE — ED Notes (Signed)
[facility] Expected date:  Expected time:  Means of arrival:  Comments: EMS-ascites

## 2013-08-31 NOTE — ED Notes (Signed)
MD at bedside. Admitting MD present to evaluate  this pt. Cardiology EDPA present to evaluate this pt. EKG given.

## 2013-08-31 NOTE — ED Provider Notes (Signed)
CSN: [id number]     Arrival date & time 08/31/13  1108 History   First MD Initiated Contact with Patient 08/31/13 1131     Chief Complaint  Patient presents with  . Abdominal Pain  . Hypoglycemia  . Altered Mental Status    HPI Pt was seen at 1130. Per pt and his mother, c/o gradual onset and persistence of constant generalized fatigue/weakness for the past 1 week. Has been associated with cough, poor PO intake, several intermittent episodes of N/V, and intermittent AMS/confusion. Family states pt has been "sleeping a lot" since being evaluated by his GI MD approx 1 week ago and rx ativan and dilaudid. Pt has hx of frequent paracentesis, with last on 08/25/13. EMS states pt's CBG was 49 on scene; given D50 with improvement to 84. Denies fevers, no rash, no CP/palpitations, no SOB, no abd pain, no diarrhea, no black or blood in stools or emesis.    Past Medical History  Diagnosis Date  . Thrombocytopenia, unspecified 04/25/2013  . Alcoholism   . Liver cirrhosis   . Pancreatitis   . Hernia    Past Surgical History  Procedure Laterality Date  . Appendectomy  2001  . Small intestine abcess  2001    History  Substance Use Topics  . Smoking status: Current Some Day Smoker    Types: Cigarettes  . Smokeless tobacco: Never Used  . Alcohol Use: Yes     Comment: 4-5 drinks a day    Review of Systems ROS: Statement: All systems negative except as marked or noted in the HPI; Constitutional: Negative for fever and chills. +generalized fatigue/weakness.  ; ; Eyes: Negative for eye pain, redness and discharge. ; ; ENMT: Negative for ear pain, hoarseness, nasal congestion, sinus pressure and sore throat. ; ; Cardiovascular: Negative for chest pain, palpitations, diaphoresis, dyspnea and peripheral edema. ; ; Respiratory: +cough. Negative for wheezing and stridor. ; ; Gastrointestinal: +N/V, poor PO intake. Negative for diarrhea, abdominal pain, blood in stool, hematemesis, jaundice and rectal  bleeding. . ; ; Genitourinary: Negative for dysuria, flank pain and hematuria. ; ; Musculoskeletal: Negative for back pain and neck pain. Negative for swelling and trauma.; ; Skin: Negative for pruritus, rash, abrasions, blisters, bruising and skin lesion.; ; Neuro: +intermittent AMS. Negative for headache, lightheadedness and neck stiffness. Negative for weakness, altered level of consciousness, extremity weakness, paresthesias, involuntary movement, seizure and syncope.      Allergies  Morphine and related  Home Medications   Current Outpatient Rx  Name  Route  Sig  Dispense  Refill  . amoxicillin-clavulanate (AUGMENTIN) 875-125 MG per tablet   Oral   Take 1 tablet by mouth 2 (two) times daily.   8 tablet   0   . folic acid (FOLVITE) 1 MG tablet   Oral   Take 1 tablet (1 mg total) by mouth daily.   30 tablet   0   . furosemide (LASIX) 20 MG tablet   Oral   Take 20 mg by mouth 2 (two) times daily.         Marland Kitchen lactulose (CHRONULAC) 10 GM/15ML solution   Oral   Take 45 mLs (30 g total) by mouth daily.   1892 mL   0   . Multiple Vitamin (MULTIVITAMIN) tablet   Oral   Take 1 tablet by mouth daily.         . Pancrelipase, Lip-Prot-Amyl, (ZENPEP) 20000 UNITS CPEP   Oral   Take 1 capsule by mouth every  morning.          . potassium chloride SA (K-DUR,KLOR-CON) 20 MEQ tablet   Oral   Take 2 tablets (40 mEq total) by mouth daily.   60 tablet   0   . promethazine (PHENERGAN) 25 MG tablet   Oral   Take 25 mg by mouth every 8 (eight) hours as needed for nausea.         . propranolol (INDERAL) 10 MG tablet   Oral   Take 1 tablet (10 mg total) by mouth 2 (two) times daily.   60 tablet   0   . RABEprazole (ACIPHEX) 20 MG tablet   Oral   Take 20 mg by mouth daily.         . rifaximin (XIFAXAN) 550 MG TABS tablet   Oral   Take 1 tablet (550 mg total) by mouth 2 (two) times daily.   60 tablet   0     If prior authorization necessary, please fax to Dr ...    . sertraline (ZOLOFT) 100 MG tablet   Oral   Take 100 mg by mouth daily.         . Simethicone (GAS-X EXTRA STRENGTH PO)   Oral   Take 1 tablet by mouth daily as needed (gas).         Marland Kitchen spironolactone (ALDACTONE) 50 MG tablet   Oral   Take 50 mg by mouth 2 (two) times daily.         . tamsulosin (FLOMAX) 0.4 MG CAPS capsule   Oral   Take 1 capsule (0.4 mg total) by mouth daily after supper.   30 capsule   0   . thiamine 100 MG tablet   Oral   Take 1 tablet (100 mg total) by mouth daily.   30 tablet   0    BP 123/55  Pulse 54  Resp 19  SpO2 96% Physical Exam 1135: Physical examination:  Nursing notes reviewed; Vital signs and O2 SAT reviewed;  Constitutional: Thin, frail, In no acute distress; Head:  Normocephalic, atraumatic; Eyes: EOMI, PERRL, No scleral icterus; ENMT: Mouth and pharynx normal, Mucous membranes very dry and cracked; Neck: Supple, Full range of motion, No lymphadenopathy; Cardiovascular: Tachycardic rate and rhythm, No gallop; Respiratory: Breath sounds coarse & equal bilaterally, No wheezes.  Speaking full sentences with ease, Normal respiratory effort/excursion; Chest: Nontender, Movement normal; Abdomen: Nontender, +mild soft distention, Normal bowel sounds; Genitourinary: No CVA tenderness; Extremities: Pulses normal, No tenderness, No edema, No calf edema or asymmetry.; Neuro: AA&Ox3, mentating per baseline per mother at bedside. Major CN grossly intact.  Speech clear. No gross focal motor or sensory deficits in extremities.; Skin: Color normal, Warm, Dry.   ED Course  Procedures    EKG Interpretation    Date/Time:  Wednesday August 31 2013 11:28:29 EST Ventricular Rate:  104 PR Interval:  166 QRS Duration: 121 QT Interval:  376 QTC Calculation: 495 R Axis:   77 Text Interpretation:  Poor data quality, interpretation may be adversely affected Artifact Baseline wander Sinus tachycardia Non-specific intra-ventricular conduction delay  Nonspecific ST and T wave abnormality Poor data quality in current ECG precludes serial comparison Confirmed by [facility]  MD, Nicholos Johns [phone]) on 08/31/2013 12:34:54 PM            MDM  MDM Reviewed: previous chart, nursing note and vitals Reviewed previous: labs and ECG Interpretation: labs, ECG and x-ray Total time providing critical care: 30-74 minutes. This excludes time spent performing  separately reportable procedures and services. Consults: admitting MD and cardiology     CRITICAL CARE Performed by: Laray Anger Total critical care time: 45 Critical care time was exclusive of separately billable procedures and treating other patients. Critical care was necessary to treat or prevent imminent or life-threatening deterioration. Critical care was time spent personally by me on the following activities: development of treatment plan with patient and/or surrogate as well as nursing, discussions with consultants, evaluation of patient's response to treatment, examination of patient, obtaining history from patient or surrogate, ordering and performing treatments and interventions, ordering and review of laboratory studies, ordering and review of radiographic studies, pulse oximetry and re-evaluation of patient's condition.   Results for orders placed during the hospital encounter of 08/31/13  GLUCOSE, CAPILLARY      Result Value Range   Glucose-Capillary 84  70 - 99 mg/dL   Comment 1 Notify RN    CBC WITH DIFFERENTIAL      Result Value Range   WBC 25.2 (*) 4.0 - 10.5 K/uL   RBC 3.66 (*) 4.22 - 5.81 MIL/uL   Hemoglobin 11.9 (*) 13.0 - 17.0 g/dL   HCT 45.4 (*) 09.8 - 11.9 %   MCV 101.4 (*) 78.0 - 100.0 fL   MCH 32.5  26.0 - 34.0 pg   MCHC 32.1  30.0 - 36.0 g/dL   RDW 14.7  82.9 - 56.2 %   Platelets 314  150 - 400 K/uL   Neutrophils Relative % 85 (*) 43 - 77 %   Lymphocytes Relative 6 (*) 12 - 46 %   Monocytes Relative 9  3 - 12 %   Eosinophils Relative 0  0 - 5 %    Basophils Relative 0  0 - 1 %   Neutro Abs 21.4 (*) 1.7 - 7.7 K/uL   Lymphs Abs 1.5  0.7 - 4.0 K/uL   Monocytes Absolute 2.3 (*) 0.1 - 1.0 K/uL   Eosinophils Absolute 0.0  0.0 - 0.7 K/uL   Basophils Absolute 0.0  0.0 - 0.1 K/uL   RBC Morphology POLYCHROMASIA PRESENT     WBC Morphology DOHLE BODIES    COMPREHENSIVE METABOLIC PANEL      Result Value Range   Sodium 140  137 - 147 mEq/L   Potassium 5.0  3.7 - 5.3 mEq/L   Chloride 98  96 - 112 mEq/L   CO2 14 (*) 19 - 32 mEq/L   Glucose, Bld 146 (*) 70 - 99 mg/dL   BUN 27 (*) 6 - 23 mg/dL   Creatinine, Ser 1.30 (*) 0.50 - 1.35 mg/dL   Calcium 8.2 (*) 8.4 - 10.5 mg/dL   Total Protein 6.4  6.0 - 8.3 g/dL   Albumin 2.3 (*) 3.5 - 5.2 g/dL   AST 865 (*) 0 - 37 U/L   ALT 62 (*) 0 - 53 U/L   Alkaline Phosphatase 212 (*) 39 - 117 U/L   Total Bilirubin 2.3 (*) 0.3 - 1.2 mg/dL   GFR calc non Af Amer 37 (*) >90 mL/min   GFR calc Af Amer 43 (*) >90 mL/min  LIPASE, BLOOD      Result Value Range   Lipase 14  11 - 59 U/L  URINALYSIS W MICROSCOPIC + REFLEX CULTURE      Result Value Range   Color, Urine ORANGE (*) YELLOW   APPearance CLEAR  CLEAR   Specific Gravity, Urine 1.023  1.005 - 1.030   pH 5.5  5.0 - 8.0  Glucose, UA NEGATIVE  NEGATIVE mg/dL   Hgb urine dipstick NEGATIVE  NEGATIVE   Bilirubin Urine SMALL (*) NEGATIVE   Ketones, ur NEGATIVE  NEGATIVE mg/dL   Protein, ur NEGATIVE  NEGATIVE mg/dL   Urobilinogen, UA 0.2  0.0 - 1.0 mg/dL   Nitrite NEGATIVE  NEGATIVE   Leukocytes, UA TRACE (*) NEGATIVE   WBC, UA 0-2  <3 WBC/hpf   Bacteria, UA RARE  RARE   Squamous Epithelial / LPF FEW (*) RARE   Casts HYALINE CASTS (*) NEGATIVE   Urine-Other MUCOUS PRESENT    AMMONIA      Result Value Range   Ammonia 190 (*) 11 - 60 umol/L  TROPONIN I      Result Value Range   Troponin I 0.74 (*) <0.30 ng/mL  PROTIME-INR      Result Value Range   Prothrombin Time 40.9 (*) 11.6 - 15.2 seconds   INR 4.49 (*) 0.00 - 1.49  PRO B NATRIURETIC PEPTIDE       Result Value Range   Pro B Natriuretic peptide (BNP) 17349.0 (*) 0 - 125 pg/mL   Dg Chest Portable 1 View 08/31/2013   CLINICAL DATA:  Abdominal pain and shortness of breath.  EXAM: PORTABLE CHEST - 1 VIEW  COMPARISON:  Chest x-ray 08/26/2003.  FINDINGS: New airspace disease throughout the entire left lung, and much of the right lung, with relative sparing of the lateral right base. Right hilar soft tissue prominence again noted. No definite pleural effusions. Heart size is normal.  IMPRESSION: 1. Interval development of widespread multifocal airspace disease asymmetrically distributed throughout the lungs bilaterally, concerning for multilobar pneumonia. 2. Persistent right hilar prominence may reflect lymphadenopathy or underlying mass. Attention on followup radiographs is recommended to ensure complete resolution of this finding.   Electronically Signed   By: Trudie Reedaniel  Entrikin M.D.   On: 08/31/2013 13:27   Results for Georgina SnellJAMES, Tamon L (MRN [id number]) as of 08/31/2013 14:51  Ref. Range 06/29/2013 15:05 06/30/2013 05:25 07/01/2013 04:18 08/31/2013 12:10 08/31/2013 12:20  BUN Latest Range: 6-23 mg/dL  10 14  27  (H)  Creatinine Latest Range: 0.50-1.35 mg/dL  4.090.67 8.110.89  9.141.98 (H)  Alkaline Phosphatase Latest Range: 39-117 U/L  206 (H) 215 (H)  212 (H)  Albumin Latest Range: 3.5-5.2 g/dL  2.6 (L) 2.6 (L)  2.3 (L)  AST Latest Range: 0-37 U/L  68 (H) 93 (H)  217 (H)  ALT Latest Range: 0-53 U/L  18 24  62 (H)  Ammonia Latest Range: 11-60 umol/L 54   190 (H)   Total Bilirubin Latest Range: 0.3-1.2 mg/dL  4.1 (H) 3.1 (H)  2.3 (H)   Results for Georgina SnellJAMES, Colson L (MRN [id number]) as of 08/31/2013 14:51  Ref. Range 06/28/2013 12:15 08/31/2013 12:20  Prothrombin Time Latest Range: 11.6-15.2 seconds 17.6 (H) 40.9 (H)  INR Latest Range: 0.00-1.49  1.49 4.49 (H)    1135:  T/C from GI Dr. Charna ElizabethJyothi Mann, case discussed, including:  HPI, pertinent PM/SHx, VS/PE, dx testing, ED course and treatment:  Requests to admit  pt to medicine service, she will consult.  1310:  Troponin and BNP elevated; EKG without acute ST elevation. Pt auto-anticoagulated with hx thrombocytopenia. Will not start ASA or heparin at this time. [facility] to [facility], case discussed, including:  HPI, pertinent PM/SHx, VS/PE, dx testing, ED course and treatment:  Agreeable to consult.  1330:  O2 Sats on NRB 96%, will desat into mid-80's when only on neb treatment, N/C O2, or venti  mask. Will start continuous neb and keep NRB on pt. Pt continues to mentate per his baseline per his mother at bedside. Denies specific CP/abd pain. States he "just feels thirsty." Speaking full sentences without difficulty, resps without distress, abd soft/NT.   1400:  Long d/w pt and his mother regarding dx and testing completed today. Questions answered.  Verb understanding, agreeable to admit. Pt and his mother both very clearly state to myself, RT and ED RN that he would like to be DNR/DNI.  Pt's critical status reiterated to pt and his mother. Both verb understanding and state they have spent the last week discussing intubation/ACLS/CPR and are emphatic that he does not want "anything heroic like that." Pt states he "saw dad die with all that" and he "doesn't want it." T/C to [facility]. Cena Benton, case discussed, including:  HPI, pertinent PM/SHx, VS/PE, dx testing, ED course and treatment:  Agreeable to admit, requests to write temporary orders, obtain stepdown bed to team 8.     Laray Anger, DO 09/03/13 [phone]

## 2013-08-31 NOTE — Progress Notes (Signed)
New orders noted for BD. Found patient on NRB mask with SpO2 77%. Auscultation reveals bilateral course crackles with expiratory wheezing. RN aware. Lasix suggested. Xopenex 1.25mg  administered. SpO2 dipped to the mid 40's with HHN via mask therapy. RN aware. Patient complains only of pain at this time, but is alert and oriented. Mother at bedside. RN placed call to MD for further orders. Patient noted to be DNR, comfort care only at this time.

## 2013-08-31 NOTE — ED Notes (Signed)
MD at bedside. EDP Clarene DukeMcManus present to evaluate pt  Sharion DoveMartha G RN present to assist

## 2013-08-31 NOTE — Consult Note (Signed)
Reason for Consult: ESLD. Referring Physician: ER-MD [name] is an 53 y.o. male.  HPI: 53 year old white male, with a longstanding history of alcohol abuse, found to be severely dehydrated after he was brought to the ER by his mother [facility]]  Along with elevated troponins and multifocal pnuemonia. As per my discussion with Dr. Wendee Beavers, after long deliberation patient decided to be DNR. He has been trying to get his Will made but somehow did not get around to doing it. He desires to be comfort care only.   Past Medical History  Diagnosis Date  . Thrombocytopenia, unspecified 04/25/2013    a. Followed by Dr. 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   . Alcoholism   . Liver cirrhosis   . Chronic pancreatitis   . Hernia   . Ascites     a. s/p multiple paracenteses.  . Esophageal varices     a. on propranolol, spironolactone.  . Normocytic anemia   . Hyponatremia   . Hepatic encephalopathy     a. 06/2013, mildly elevated ammonia.  . Aspiration pneumonia     a. 06/2013. Abnormal CT with ? infxn vs mass.    Past Surgical History  Procedure Laterality Date  . Appendectomy  2001  . Small intestine abcess  2001   Family History  Problem Relation Age of Onset  . Diabetes     Social History:  reports that he has been smoking Cigarettes.  He has been smoking about 0.00 packs per day. He has never used smokeless tobacco. He reports that he drinks alcohol. He reports that he does not use illicit drugs.  Allergies:  Allergies  Allergen Reactions  . Morphine And Related Nausea Only   Medications: I have reviewed the patient's current medications.  Results for orders placed during the hospital encounter of 08/31/13 (from the past 48 hour(s))  GLUCOSE, CAPILLARY     Status: None   Collection Time    08/31/13 11:22 AM      Result Value Range   Glucose-Capillary 84  70 - 99 mg/dL   Comment 1 Notify RN    AMMONIA      Status: Abnormal   Collection Time    08/31/13 12:10 PM      Result Value Range   Ammonia 190 (*) 11 - 60 umol/L  CBC WITH DIFFERENTIAL     Status: Abnormal   Collection Time    08/31/13 12:20 PM      Result Value Range   WBC 25.2 (*) 4.0 - 10.5 K/uL   RBC 3.66 (*) 4.22 - 5.81 MIL/uL   Hemoglobin 11.9 (*) 13.0 - 17.0 g/dL   HCT 37.1 (*) 39.0 - 52.0 %   MCV 101.4 (*) 78.0 - 100.0 fL   MCH 32.5  26.0 - 34.0 pg   MCHC 32.1  30.0 - 36.0 g/dL   RDW 14.0  11.5 - 15.5 %   Platelets 314  150 - 400 K/uL   Neutrophils Relative % 85 (*) 43 - 77 %   Lymphocytes Relative 6 (*) 12 - 46 %   Monocytes Relative 9  3 - 12 %   Eosinophils Relative 0  0 - 5 %   Basophils Relative 0  0 - 1 %   Neutro Abs 21.4 (*) 1.7 - 7.7 K/uL   Lymphs Abs 1.5  0.7 - 4.0 K/uL   Monocytes Absolute 2.3 (*) 0.1 - 1.0  K/uL   Eosinophils Absolute 0.0  0.0 - 0.7 K/uL   Basophils Absolute 0.0  0.0 - 0.1 K/uL   RBC Morphology POLYCHROMASIA PRESENT     WBC Morphology DOHLE BODIES     Comment: VACUOLATED NEUTROPHILS  COMPREHENSIVE METABOLIC PANEL     Status: Abnormal   Collection Time    08/31/13 12:20 PM      Result Value Range   Sodium 140  137 - 147 mEq/L   Potassium 5.0  3.7 - 5.3 mEq/L   Chloride 98  96 - 112 mEq/L   CO2 14 (*) 19 - 32 mEq/L   Glucose, Bld 146 (*) 70 - 99 mg/dL   BUN 27 (*) 6 - 23 mg/dL   Creatinine, Ser 1.98 (*) 0.50 - 1.35 mg/dL   Calcium 8.2 (*) 8.4 - 10.5 mg/dL   Total Protein 6.4  6.0 - 8.3 g/dL   Albumin 2.3 (*) 3.5 - 5.2 g/dL   AST 217 (*) 0 - 37 U/L   ALT 62 (*) 0 - 53 U/L   Alkaline Phosphatase 212 (*) 39 - 117 U/L   Total Bilirubin 2.3 (*) 0.3 - 1.2 mg/dL   GFR calc non Af Amer 37 (*) >90 mL/min   GFR calc Af Amer 43 (*) >90 mL/min   Comment: (NOTE)     The eGFR has been calculated using the CKD EPI equation.     This calculation has not been validated in all clinical situations.     eGFR's persistently <90 mL/min signify possible Chronic Kidney     Disease.  LIPASE, BLOOD      Status: None   Collection Time    08/31/13 12:20 PM      Result Value Range   Lipase 14  11 - 59 U/L  TROPONIN I     Status: Abnormal   Collection Time    08/31/13 12:20 PM      Result Value Range   Troponin I 0.74 (*) <0.30 ng/mL   Comment:            Due to the release kinetics of cTnI,     a negative result within the first hours     of the onset of symptoms does not rule out     myocardial infarction with certainty.     If myocardial infarction is still suspected,     repeat the test at appropriate intervals.     CRITICAL RESULT CALLED TO, READ BACK BY AND VERIFIED WITH:     S.WEST RN AT [id number] ON [id number] BY C.BONGEL  PROTIME-INR     Status: Abnormal   Collection Time    08/31/13 12:20 PM      Result Value Range   Prothrombin Time 40.9 (*) 11.6 - 15.2 seconds   INR 4.49 (*) 0.00 - 1.49  PRO B NATRIURETIC PEPTIDE     Status: Abnormal   Collection Time    08/31/13 12:20 PM      Result Value Range   Pro B Natriuretic peptide (BNP) 17349.0 (*) 0 - 125 pg/mL  URINALYSIS W MICROSCOPIC + REFLEX CULTURE     Status: Abnormal   Collection Time    08/31/13  1:16 PM      Result Value Range   Color, Urine ORANGE (*) YELLOW   Comment: BIOCHEMICALS MAY BE AFFECTED BY COLOR   APPearance CLEAR  CLEAR   Specific Gravity, Urine 1.023  1.005 - 1.030   pH 5.5  5.0 - 8.0  Glucose, UA NEGATIVE  NEGATIVE mg/dL   Hgb urine dipstick NEGATIVE  NEGATIVE   Bilirubin Urine SMALL (*) NEGATIVE   Ketones, ur NEGATIVE  NEGATIVE mg/dL   Protein, ur NEGATIVE  NEGATIVE mg/dL   Urobilinogen, UA 0.2  0.0 - 1.0 mg/dL   Nitrite NEGATIVE  NEGATIVE   Leukocytes, UA TRACE (*) NEGATIVE   WBC, UA 0-2  <3 WBC/hpf   Bacteria, UA RARE  RARE   Squamous Epithelial / LPF FEW (*) RARE   Casts HYALINE CASTS (*) NEGATIVE   Comment: WBC CAST   Urine-Other MUCOUS PRESENT      Dg Chest Portable 1 View  08/31/2013   CLINICAL DATA:  Abdominal pain and shortness of breath.  EXAM: PORTABLE CHEST - 1 VIEW  COMPARISON:   Chest x-ray 08/26/2003.  FINDINGS: New airspace disease throughout the entire left lung, and much of the right lung, with relative sparing of the lateral right base. Right hilar soft tissue prominence again noted. No definite pleural effusions. Heart size is normal.  IMPRESSION: 1. Interval development of widespread multifocal airspace disease asymmetrically distributed throughout the lungs bilaterally, concerning for multilobar pneumonia. 2. Persistent right hilar prominence may reflect lymphadenopathy or underlying mass. Attention on followup radiographs is recommended to ensure complete resolution of this finding.   Electronically Signed   By: Vinnie Langton M.D.   On: 08/31/2013 13:27   Review of Systems  Unable to perform ROS Constitutional: Positive for chills, weight loss, malaise/fatigue and diaphoresis. Negative for fever.  HENT: Negative.   Respiratory: Positive for hemoptysis and shortness of breath. Negative for sputum production.   Cardiovascular: Positive for leg swelling. Negative for chest pain and palpitations.  Gastrointestinal: Positive for heartburn and abdominal pain. Negative for nausea, vomiting, diarrhea, constipation, blood in stool and melena.  Musculoskeletal: Positive for back pain and joint pain.  Skin: Negative.   Neurological: Positive for weakness.  Psychiatric/Behavioral: Positive for depression. The patient is nervous/anxious and has insomnia.    Blood pressure 112/56, pulse 102, temperature 97.7 F (36.5 C), temperature source Axillary, resp. rate 29, height 6' (1.829 m), weight 68.493 kg (151 lb), SpO2 89.00%. Physical Exam  Constitutional: He is oriented to person, place, and time. He appears cachectic. He is cooperative. He does not have a sickly appearance. He appears distressed. Face mask in place.  Emaciated, malnourished, white male  HENT:  Head: Normocephalic.  Eyes: EOM and lids are normal. Pupils are equal, round, and reactive to light. Scleral  icterus is present.  Neck: Trachea normal and normal range of motion. Neck supple. No mass and no thyromegaly present.  Cardiovascular: Normal rate and regular rhythm.   Respiratory: He has decreased breath sounds.  GI: Soft. He exhibits shifting dullness, distension, fluid wave and ascites. There is no hepatosplenomegaly. There is tenderness. There is no CVA tenderness. A hernia is present.  Umbilical hernia present  Musculoskeletal:  Muscular wasting noted on all limbs  Neurological: He is alert and oriented to person, place, and time.  Skin: Skin is warm and dry.   Assessment/Plan: 1) ESLD with ascites, umbilical hernia and now with elevated troponin levels and multifocal pnuemonia with an elevated WBC count-25K; patient desires to be DNR and is requesting comfort care measure only. I have discussed this with Dr. Wendee Beavers. I have ordered IV Dilaudid for the patient. Will follow as needed. I have had a lengthy discussion with his mother who agrees with her son's end of life decisions.  [facility] 08/31/2013, 7:45 PM

## 2013-08-31 NOTE — ED Notes (Signed)
Pulmonary MD Coralyn HellingVineet Sood in to see pt- made DNR

## 2013-08-31 NOTE — Consult Note (Signed)
Cardiology Consultation Note  Patient ID: [facility], MRN: [id number], DOB/AGE: 04/09/1961 53 y.o. Admit date: 08/31/2013   Date of Consult: 08/31/2013 Primary Physician: Warrick Parisian, MD Primary Cardiologist: New to [place] Hilty MD  Chief Complaint: lethargy Reason for Consult: elevated troponin and abnormal EKG in a patient with advanced liver disease with complications, admitted with acute respiratory failure, AKI, autoanticoagulation, leukocytosis and multifocal pneumonia  HPI: [facility] is a 53 y/o M with no prior cardiac history but significant alcoholism complicated by cirrhosis, chronic pancreatitis, ascities s/p multiple paracenteses, esophageal varices, anemia, thrombocytopenia who presented to [facility] with lethargy, stomach pain/distention, and weakness. He was last admitted 06/2013 with possible aspiration PNA, hepatic encephalopathy and ascites with CT possibly showing infection vs mass with recommendation to follow as outpatient. His liver disease has been managed with Lasix, spironolactone, paracenteses, and propranolol. He's had 3 paracenteses in the last 60 days. He's felt poorly the last few days with the above. He also reports SOB, nausea, vomiting, and intermittent AMS/confusion. He feels symptoms went downhill after given rx for dilaudid as outpatient last week. He reports vague intermittent CP but he's never truly paid attention to frequency or duration. It comes on randomly - has had both exertion and at rest. No current CP. His mother says he never has mentioned CP before. No palpitations, syncope, BRBPR, but has had intermittent melena "for years." Quit drinking 2 months ago.  In the ER he has acute resp failure with room air sat down to 54% requiring NRB. CXR showing concern for multilobar PNA, and persistent R hilar prominence question LAD vs mass. SIgnificant leukocytosis WBC 25k with Dohle bodies, AKI with BUN/Cr 27/1.98, elevated LFTs, elevated ammonia, albumin 2.3, and  autoanticoagulation with INR 4.49. We are asked to see regarding elevated troponin 0.74, pBNP 17k and abnormal EKG. No prior echocardiogram.  Past Medical History  Diagnosis Date  . Thrombocytopenia, unspecified 04/25/2013    a. Followed by Dr. 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   . Alcoholism   . Liver cirrhosis   . Chronic pancreatitis   . Hernia   . Ascites     a. s/p multiple paracenteses.  . Esophageal varices     a. on propranolol, spironolactone.  . Normocytic anemia   . Hyponatremia   . Hepatic encephalopathy     a. 06/2013, mildly elevated ammonia.  . Aspiration pneumonia     a. 06/2013. Abnormal CT with ? infxn vs mass.      Most Recent Cardiac Studies: None   Surgical History:  Past Surgical History  Procedure Laterality Date  . Appendectomy  2001  . Small intestine abcess  2001     Home Meds: Prior to Admission medications   Medication Sig Start Date End Date Taking? Authorizing Provider  folic acid (FOLVITE) 1 MG tablet Take 1 mg by mouth daily.   Yes Historical Provider, MD  furosemide (LASIX) 20 MG tablet Take 20 mg by mouth 2 (two) times daily.    Yes Historical Provider, MD  HYDROmorphone (DILAUDID) 4 MG tablet Take 4 mg by mouth 2 (two) times daily as needed for moderate pain or severe pain.   Yes Historical Provider, MD  lactulose (CHRONULAC) 10 GM/15ML solution Take 45 mLs (30 g total) by mouth daily. 07/01/13  Yes Renae Fickle, MD  LORazepam (ATIVAN) 1 MG tablet Take 1 mg by mouth 3 (three) times daily.   Yes Historical Provider, MD  Multiple Vitamin (MULTIVITAMIN) tablet Take 1 tablet by mouth daily.   Yes Historical Provider, MD  Pancrelipase, Lip-Prot-Amyl, (ZENPEP) 20000 UNITS CPEP Take 1 capsule by mouth every morning.    Yes Historical Provider, MD  potassium chloride SA (K-DUR,KLOR-CON) 20 MEQ tablet Take 2 tablets (40 mEq total) by mouth daily. 07/01/13  Yes Renae Fickle,  MD  promethazine (PHENERGAN) 25 MG tablet Take 25 mg by mouth every 8 (eight) hours as needed for nausea.   Yes Historical Provider, MD  propranolol (INDERAL) 10 MG tablet Take 1 tablet (10 mg total) by mouth 2 (two) times daily. 07/01/13  Yes Renae Fickle, MD  RABEprazole (ACIPHEX) 20 MG tablet Take 20 mg by mouth daily.   Yes Historical Provider, MD  rifaximin (XIFAXAN) 550 MG TABS tablet Take 1 tablet (550 mg total) by mouth 2 (two) times daily. 07/01/13  Yes Renae Fickle, MD  sertraline (ZOLOFT) 100 MG tablet Take 100 mg by mouth daily.   Yes Historical Provider, MD  Simethicone (GAS-X EXTRA STRENGTH PO) Take 1 tablet by mouth daily as needed (For gas pain.).    Yes Historical Provider, MD  spironolactone (ALDACTONE) 50 MG tablet Take 50 mg by mouth daily.    Yes Historical Provider, MD  thiamine 100 MG tablet Take 1 tablet (100 mg total) by mouth daily. 07/01/13  Yes Renae Fickle, MD  tamsulosin (FLOMAX) 0.4 MG CAPS capsule Take 1 capsule (0.4 mg total) by mouth daily after supper. 07/01/13   Renae Fickle, MD    Inpatient Medications:  . [START ON 09/01/2013] vancomycin  750 mg Intravenous Q12H   . ceFEPime (MAXIPIME) IV 1 g (08/31/13 1423)  . vancomycin      Allergies:  Allergies  Allergen Reactions  . Morphine And Related Nausea Only    History   Social History  . Marital Status: Divorced    Spouse Name: N/A    Number of Children: N/A  . Years of Education: N/A   Occupational History  . Not on file.   Social History Main Topics  . Smoking status: Current Some Day Smoker    Types: Cigarettes  . Smokeless tobacco: Never Used  . Alcohol Use: Yes     Comment: 4-5 drinks a day  . Drug Use: No  . Sexual Activity: Not on file   Other Topics Concern  . Not on file   Social History Narrative  . No narrative on file     Family History  Problem Relation Age of Onset  . Diabetes       Review of Systems: General: random subjective fevers at home per  patient Cardiovascular: see above, no LEE Respiratory: +cough Urologic: negative for hematuria Abdominal:see above Neurologic: negative for syncope, or dizziness All other systems reviewed and are otherwise negative except as noted above.  Labs:  Recent Labs  08/31/13 1220  TROPONINI 0.74*   Lab Results  Component Value Date   WBC 25.2* 08/31/2013   HGB 11.9* 08/31/2013   HCT 37.1* 08/31/2013   MCV 101.4* 08/31/2013   PLT 314 08/31/2013    Recent Labs Lab 08/31/13 1220  NA 140  K 5.0  CL 98  CO2 14*  BUN 27*  CREATININE 1.98*  CALCIUM 8.2*  PROT 6.4  BILITOT 2.3*  ALKPHOS 212*  ALT 62*  AST 217*  GLUCOSE 146*   Radiology/Studies:  Dg Chest 2 View  08/25/2013   CLINICAL DATA:  Shortness of breath for 2 months. Ascites and pleural effusion.  EXAM:  CHEST  2 VIEW  COMPARISON:  CT chest 07/15/2013.  Chest radiographs 06/28/2013.  FINDINGS: The cardiac silhouette is within normal limits for size. Rounded opacity overlying the right hilum has mildly increased in prominence compared to prior chest radiographs, however the patient is slightly rotated on the current examination. There may be a small right pleural effusion. No left-sided pleural effusion is seen. There is elevation the right hemidiaphragm with mild right basilar opacity present. The left lung is clear. Old inferior right rib fractures are noted.  IMPRESSION: 1. Mildly increased prominence of rounded opacity overlying the right hilum. This may reflect residual opacity from previously described consolidation as well as superimposed ascending aorta due to rotation on today's examination. Attention to this is recommended on follow-up CT as outlined on the 07/15/2013 report. 2. Elevation of the right hemidiaphragm with mild right basilar opacity, most likely subsegmental atelectasis. 3. Possible small right pleural effusion.   Electronically Signed   By: Sebastian Ache   On: 08/25/2013 15:09   US Paracentesis  08/17/2013    CLINICAL DATA:  Abdominal distention secondary to recurrent ascites. Request therapeutic paracentesis.  EXAM: ULTRASOUND GUIDED PARACENTESIS  COMPARISON:  Previous paracentesis  PROCEDURE: An ultrasound guided paracentesis was thoroughly discussed with the patient and questions answered. The benefits, risks, alternatives and complications were also discussed. The patient understands and wishes to proceed with the procedure. Written consent was obtained.  Ultrasound was performed to localize and mark an adequate pocket of fluid in the right lower quadrant of the abdomen. The area was then prepped and draped in the normal sterile fashion. 1% Lidocaine was used for local anesthesia. Under ultrasound guidance a 19 gauge Yueh catheter was introduced. Paracentesis was performed. The catheter was removed and a dressing applied.  COMPLICATIONS: None immediate  FINDINGS: A total of approximately 9.2 L of amber colored fluid was removed. A fluid sample was not sent for laboratory analysis.  IMPRESSION: Successful ultrasound guided paracentesis yielding 9.2 L of ascites.  The patient also received a total of 75g IV albumin.  Read by: Brayton El PA-C   Electronically Signed   By: Malachy Moan M.D.   On: 08/17/2013 14:17   Dg Chest Portable 1 View  08/31/2013   CLINICAL DATA:  Abdominal pain and shortness of breath.  EXAM: PORTABLE CHEST - 1 VIEW  COMPARISON:  Chest x-ray 08/26/2003.  FINDINGS: New airspace disease throughout the entire left lung, and much of the right lung, with relative sparing of the lateral right base. Right hilar soft tissue prominence again noted. No definite pleural effusions. Heart size is normal.  IMPRESSION: 1. Interval development of widespread multifocal airspace disease asymmetrically distributed throughout the lungs bilaterally, concerning for multilobar pneumonia. 2. Persistent right hilar prominence may reflect lymphadenopathy or underlying mass. Attention on followup radiographs is  recommended to ensure complete resolution of this finding.   Electronically Signed   By: Trudie Reed M.D.   On: 08/31/2013 13:27   EKG: sinus tach 104bpm,lots of baseline wander due to patient movement, appears to be TWI inferolaterally (III, avF, V3-V6) F/u ecg:  Sinus tach 103bpm, essentially same interpretation as above  Physical Exam: Blood pressure 109/52, pulse 102, temperature 96.6 F (35.9 C), temperature source Rectal, resp. rate 15, height 6' (1.829 m), weight 151 lb (68.493 kg), SpO2 88.00%. General: Chronically ill dissheveled thin WM in no acute distress. Head: Normocephalic, atraumatic, sclera non-icteric, no xanthomas, nares are without discharge.  Neck: JVD slightly elevated. Lungs: diffuse wheezing throughout. No  rales or rhonchi heard. Breathing is unlabored. Heart: RRR borderline tachycardic with S1 S2. No murmurs, rubs, or gallops appreciated. Abdomen: Soft, non-tender, non-distended with normoactive bowel sounds. No hepatomegaly. No rebound/guarding. No obvious abdominal masses. Msk:  Strength and tone appear normal for age. Extremities: No clubbing or cyanosis. No edema.  Distal pedal pulses are 2+ and equal bilaterally. Neuro: Alert and oriented X 3. No facial asymmetry. No focal deficit. Moves all extremities spontaneously. Psych:  Responds to questions appropriately with a normal affect.   Assessment and Plan:   1. Advanced liver disease due to alcoholism complicated by ascites, chronic pancreatitis, esophageal varices, auto-anticoagulation, anemia and thrombocytopenia 2. Autoanticoagulation - INR 4.49 3. Recent lethargy with elevated ammonia level, question hepatic encephalopathy 4. Acute respiratory failure in the setting of multifocal PNA 5. Acute renal insufficiency 6. Elevated troponin in the setting of significant illness  Suspect troponin is due to demand ischemia in the setting of severe underlying illness and hypoxia. He is not a candidate for  invasive cardiac procedures with elevated INR, AKI, and complications of advanced liver disease. Would avoid anticoagulation for now given supratherapeutic INR. Will defer possible need for vitamin K to primary team. Avoid aspirin for now due to risk for bleeding. Continue propranolol as BP allows. No statin due to liver disease. The fact that he has required multiple paracenteses in the last 60 days is a poor prognostic indicator. We will obtain echocardiogram to get a better idea of baseline cardiac status. See below for additional thoughts.  Signed, Garren Greenman PA-C 08/31/2013, 2:25 PM

## 2013-08-31 NOTE — Consult Note (Signed)
Pt. Seen and examined. Agree with the NP/PA-C note as written.  Unfortunate 53 yo male with advanced liver disease and cirrhosis secondary to long-standing alcohol use. He presents with what appears to be a multifocal PNA. He is quite hypoxic and appears in respiratory distress, barely maintaining an SPo2 of 90 on 100% NRB.  He has previously expressed DNI status, but is reconsidering.  He did have an elevated troponin, but has not expressed chest pain. This is likely a Type II mechanism or may not be related to ischemia at all.  Nevertheless, I would recommend an echocardiogram to evaluate for cardiomyopathy or focal wall motion abnormalities. We cannot anticoagulate him, even with aspirin, for fear of variceal bleeding (he has known varices).  He is already on b-blocker.  I would not recommend heparin for the same reasons. He is not really a cath candidate because he is too high risk to anticoagulate.  Therefore, conservative management is preferred. Consideration should be given to palliative care if he declines and does not want resuscitation.  Thanks for consulting us.  Chrystie NoseKenneth C. Dyonna Jaspers, MD, [facility] - Quail CreekFACC Attending Cardiologist [facility]

## 2013-08-31 NOTE — Progress Notes (Signed)
PHARMACY BRIEF NOTE - Antibiotics  Consult for:  Zosyn Indication:  Pneumonia, history of aspiration pneumonia  Asked to assist with Zosyn therapy for this 53 year-old male who is currently receiving Vancomycin therapy for pneumonia.  (Please see previous pharmacy note for today.)  The serum creatinine is elevated at 1.98, but the calculated creatinine clearance of 42.3 ml/min is adequate for Zosyn at the standard dose.  Plan: Zosyn 3.375 grams IV every 8 hours, each dose infused over 4 hours.  Polo Rileylaire Taisia Fantini R.Ph. 08/31/2013 9:37 PM

## 2013-08-31 NOTE — Consult Note (Signed)
Name: Alexander Burch MRN: [id number] DOB: April 26, 1961    ADMISSION DATE:  08/31/2013 CONSULTATION DATE:  08/31/2013  REFERRING MD :  Penny Pia  CHIEF COMPLAINT:  Low blood pressure  BRIEF PATIENT DESCRIPTION:  53 yo male with hx of ETOH with cirrhosis presents with abdominal discomfort.  Had recent admission for aspiration pneumonia.  Noted to be hypotensive in ER and PCCM consulted to assess sepsis.  SIGNIFICANT EVENTS: 1/21 Admit, PCCM consult, DNR established >> no CVL, no CPR, no intubation.  STUDIES:    LINES / TUBES: PIV  CULTURES: C diff 1/21 >> Blood 1/21 >>  ANTIBIOTICS: Cefepime 1/21 >> Vancomycin 1/21 >>   HISTORY OF PRESENT ILLNESS:   53 yo male with hx of ETOH with cirrhosis presents with abdominal discomfort.  Had recent admission for aspiration pneumonia.  Noted to be hypotensive in ER and PCCM consulted to assess sepsis.  He can pass flatus.  He feels more bloated.  This causes discomfort with his breathing.  He feels thirsty, and his mouth is dry.  As a result he reports trouble swallowing.  He has cough with sputum, but denies chest pain.  His breathing has improved with supplemental oxygen.  His mother reports that he uses ativan, and is concerned that he gets too anxious if he does not get his ativan.  PAST MEDICAL HISTORY :  Past Medical History  Diagnosis Date  . Thrombocytopenia, unspecified 04/25/2013    a. Followed by Dr. 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   . Alcoholism   . Liver cirrhosis   . Chronic pancreatitis   . Hernia   . Ascites     a. s/p multiple paracenteses.  . Esophageal varices     a. on propranolol, spironolactone.  . Normocytic anemia   . Hyponatremia   . Hepatic encephalopathy     a. 06/2013, mildly elevated ammonia.  . Aspiration pneumonia     a. 06/2013. Abnormal CT with ? infxn vs mass.   Past Surgical History  Procedure Laterality Date  .  Appendectomy  2001  . Small intestine abcess  2001   Prior to Admission medications   Medication Sig Start Date End Date Taking? Authorizing Provider  folic acid (FOLVITE) 1 MG tablet Take 1 mg by mouth daily.   Yes Historical Provider, MD  furosemide (LASIX) 20 MG tablet Take 20 mg by mouth 2 (two) times daily.    Yes Historical Provider, MD  HYDROmorphone (DILAUDID) 4 MG tablet Take 4 mg by mouth 2 (two) times daily as needed for moderate pain or severe pain.   Yes Historical Provider, MD  lactulose (CHRONULAC) 10 GM/15ML solution Take 45 mLs (30 g total) by mouth daily. 07/01/13  Yes Renae Fickle, MD  LORazepam (ATIVAN) 1 MG tablet Take 1 mg by mouth 3 (three) times daily.   Yes Historical Provider, MD  Multiple Vitamin (MULTIVITAMIN) tablet Take 1 tablet by mouth daily.   Yes Historical Provider, MD  Pancrelipase, Lip-Prot-Amyl, (ZENPEP) 20000 UNITS CPEP Take 1 capsule by mouth every morning.    Yes Historical Provider, MD  potassium chloride SA (K-DUR,KLOR-CON) 20 MEQ tablet Take 2 tablets (40 mEq total) by mouth daily. 07/01/13  Yes Renae Fickle, MD  promethazine (PHENERGAN) 25 MG tablet Take 25 mg by mouth every 8 (eight) hours as needed for nausea.   Yes Historical Provider, MD  propranolol (INDERAL) 10 MG tablet Take 1 tablet (10 mg  total) by mouth 2 (two) times daily. 07/01/13  Yes Renae Fickle, MD  RABEprazole (ACIPHEX) 20 MG tablet Take 20 mg by mouth daily.   Yes Historical Provider, MD  rifaximin (XIFAXAN) 550 MG TABS tablet Take 1 tablet (550 mg total) by mouth 2 (two) times daily. 07/01/13  Yes Renae Fickle, MD  sertraline (ZOLOFT) 100 MG tablet Take 100 mg by mouth daily.   Yes Historical Provider, MD  Simethicone (GAS-X EXTRA STRENGTH PO) Take 1 tablet by mouth daily as needed (For gas pain.).    Yes Historical Provider, MD  spironolactone (ALDACTONE) 50 MG tablet Take 50 mg by mouth daily.    Yes Historical Provider, MD  thiamine 100 MG tablet Take 1 tablet (100 mg  total) by mouth daily. 07/01/13  Yes Renae Fickle, MD  tamsulosin (FLOMAX) 0.4 MG CAPS capsule Take 1 capsule (0.4 mg total) by mouth daily after supper. 07/01/13   Renae Fickle, MD   Allergies  Allergen Reactions  . Morphine And Related Nausea Only    FAMILY HISTORY:  Family History  Problem Relation Age of Onset  . Diabetes     SOCIAL HISTORY:  reports that he has been smoking Cigarettes.  He has been smoking about 0.00 packs per day. He has never used smokeless tobacco. He reports that he drinks alcohol. He reports that he does not use illicit drugs.  REVIEW OF SYSTEMS:  Negative except above  SUBJECTIVE:   VITAL SIGNS: Temp:  [96.6 F (35.9 C)] 96.6 F (35.9 C) (01/21 1316) Pulse Rate:  [54-107] 107 (01/21 1700) Resp:  [15-21] 20 (01/21 1700) BP: (107-123)/(52-56) 107/54 mmHg (01/21 1700) SpO2:  [54 %-96 %] 89 % (01/21 1700) Weight:  [151 lb (68.493 kg)] 151 lb (68.493 kg) (01/21 1424) INTAKE / OUTPUT: Intake/Output     01/20 0701 - 01/21 0700 01/21 0701 - 01/22 0700   Urine (mL/kg/hr)  100   Total Output   100   Net   -100          PHYSICAL EXAMINATION: General: Ill appearing Neuro:  Alert, normal strength, CN intact, follows commands HEENT:  Pupils reactive, no sinus tenderness, dry oral mucosa, temporal wasting Cardiovascular:  Regular, tachycardic Lungs:  B/l rhonchi, no wheeze Abdomen:  Distended, mild tenderness diffusely, increased tympany, decreased bowel sounds Musculoskeletal:  Ankle edema, decreased muscle bulk Skin:  No rashes  LABS:  CBC  Recent Labs Lab 08/31/13 1220  WBC 25.2*  HGB 11.9*  HCT 37.1*  PLT 314   Coag's  Recent Labs Lab 08/31/13 1220  INR 4.49*   BMET  Recent Labs Lab 08/31/13 1220  NA 140  K 5.0  CL 98  CO2 14*  BUN 27*  CREATININE 1.98*  GLUCOSE 146*   Electrolytes  Recent Labs Lab 08/31/13 1220  CALCIUM 8.2*   Liver Enzymes  Recent Labs Lab 08/31/13 1220  AST 217*  ALT 62*  ALKPHOS  212*  BILITOT 2.3*  ALBUMIN 2.3*   Cardiac Enzymes  Recent Labs Lab 08/31/13 1220  TROPONINI 0.74*  PROBNP 17349.0*   Glucose  Recent Labs Lab 08/31/13 1122  GLUCAP 84    Imaging Dg Chest Portable 1 View  08/31/2013   CLINICAL DATA:  Abdominal pain and shortness of breath.  EXAM: PORTABLE CHEST - 1 VIEW  COMPARISON:  Chest x-ray 08/26/2003.  FINDINGS: New airspace disease throughout the entire left lung, and much of the right lung, with relative sparing of the lateral right base. Right hilar soft tissue  prominence again noted. No definite pleural effusions. Heart size is normal.  IMPRESSION: 1. Interval development of widespread multifocal airspace disease asymmetrically distributed throughout the lungs bilaterally, concerning for multilobar pneumonia. 2. Persistent right hilar prominence may reflect lymphadenopathy or underlying mass. Attention on followup radiographs is recommended to ensure complete resolution of this finding.   Electronically Signed   By: Trudie Reedaniel  Entrikin M.D.   On: 08/31/2013 13:27    ASSESSMENT / PLAN:  PULMONARY A: Acute respiratory failure 2nd to aspiration pneumonia. Tobacco abuse. P:   -supplemental oxygen to keep SpO2 > 92% -no BiPAP, no intubation per pt/family request  CARDIOVASCULAR A:  Sepsis. P:  -continue IV fluids >> could use albumin if needed in this setting -pt would be okay with pressor agents if needed at low dose through peripheral IV >> he would not want central venous access -no CPR, no defibrillation -hold propranolol, lasix, aldactone while hypotensive  RENAL A:   Acute kidney injury. Metabolic acidosis >> likely from sepsis and inability to clear lactic acid in setting of cirrhosis. Urine retention. P:   -monitor renal fx, urine outpt, electrolytes -hold flomax  GASTROINTESTINAL A:   Abdominal pain. Alcoholic cirrhosis. Protein calorie malnutrition. P:   -NPO -protonix for SUP  HEMATOLOGIC A:    Leukocytosis. P:  -f/u CBC  INFECTIOUS A:   Aspiration pneumonia. Abdominal pain. P:   -Abx per primary team  ENDOCRINE A:   Mild hyperglycemia. P:   -per primary team  NEUROLOGIC A:   Chronic pain, anxiety with hx of ETOH. P:   -defer zoloft, dilaudid, ativan to primary team  Goals of Care: Discussed in detail with pt and his mother.  They want to continue therapies with the hope that he can improve.  They are in agreement that if his medical status deteriorates, then focus of care should be shifted to maintaining comfort.  They would not want central venous access, intubation, CPR, or defibrillation.  They would be okay with low dose pressor agent through peripheral IV if needed, but only if this would help him recover rather than prolong his care.  Also discussed the option of palliative care consult to further discuss goals of care, assist with symptom management, and possibly arrange for hospice care >> they were very receptive to this.  Discussed plan with Dr. Cena BentonVega.  CC time 50 minutes.  PCCM can be available as needed.  Please call is further assistance is required.  Coralyn HellingVineet Fendi Meinhardt, MD [facility] 08/31/2013, 5:54 PM Pager:  [phone] After 3pm call: [phone]

## 2013-08-31 NOTE — H&P (Addendum)
[facility] History and Physical  Alexander Burch WUJ:[id number] DOB: 04/24/1961 DOA: 08/31/2013  Referring physician: Dr. Clarene DukeMcManus PCP: Warrick ParisianSTALLINGS,SHEILA, MD   Chief Complaint:  "Dr. Loreta AveMann told me to come here"  HPI: Alexander Snellndrew L Bolen is a 53 y.o. male  With alcoholic liver cirrhosis, history of hepatic encephalopathy, and recent admission for pneumonia thought to be secondary to aspiration pneumonia. Presenting this admission complaining of abdominal discomfort. Patient states that he presented to the ED because his gastroenterologist recommended he come for further evaluation. Patient reports that within the last 3 weeks he has received antibiotics off and on for lung infection. Patient recently admitted and treated for suspected aspiration pneumonia. His presenting today complaining of cough and some shortness of breath. Has also been complaining of abdominal discomfort which his gastroenterologist have been treating as outpatient.  While in the ED patient was found to have marked leukocytosis, low body temperature of 96.6, elevated heart rate of 102, elevated troponin of 0.74 of which ED physician consult at cardiology. We were consult at for further evaluation and recommendations.   Review of Systems:  Constitutional:  + weight loss, night sweats, HEENT:  No headaches, Difficulty swallowing,Tooth/dental problems,Sore throat,  No sneezing, itching, ear ache, nasal congestion, post nasal drip,  Cardio-vascular:  No chest pain, Orthopnea, PND, + swelling in lower extremities, dizziness, palpitations  GI:  No heartburn, indigestion, + abdominal pain, nausea, vomiting, diarrhea, change in bowel habits, loss of appetite  Resp:  + shortness of breath with exertion or at rest. No excess mucus, no productive cough, + non-productive cough, No coughing up of blood.No change in color of mucus.No wheezing.No chest wall deformity  Skin:  no rash or lesions.  GU:  no dysuria, change in color of  urine, no urgency or frequency. No flank pain.  Musculoskeletal:  No joint pain or swelling. No decreased range of motion. No back pain.  Psych:  No change in mood or affect. No depression or anxiety. No memory loss.   Past Medical History  Diagnosis Date  . Thrombocytopenia, unspecified 04/25/2013    a. Followed by Dr. 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   . Alcoholism   . Liver cirrhosis   . Chronic pancreatitis   . Hernia   . Ascites     a. s/p multiple paracenteses.  . Esophageal varices     a. on propranolol, spironolactone.  . Normocytic anemia   . Hyponatremia   . Hepatic encephalopathy     a. 06/2013, mildly elevated ammonia.  . Aspiration pneumonia     a. 06/2013. Abnormal CT with ? infxn vs mass.   Past Surgical History  Procedure Laterality Date  . Appendectomy  2001  . Small intestine abcess  2001   Social History:  reports that he has been smoking Cigarettes.  He has been smoking about 0.00 packs per day. He has never used smokeless tobacco. He reports that he drinks alcohol. He reports that he does not use illicit drugs.  Allergies  Allergen Reactions  . Morphine And Related Nausea Only    Family History  Problem Relation Age of Onset  . Diabetes       Prior to Admission medications   Medication Sig Start Date End Date Taking? Authorizing Provider  folic acid (FOLVITE) 1 MG tablet Take 1 mg by mouth daily.   Yes Historical Provider, MD  furosemide (LASIX) 20 MG tablet Take 20 mg by mouth  2 (two) times daily.    Yes Historical Provider, MD  HYDROmorphone (DILAUDID) 4 MG tablet Take 4 mg by mouth 2 (two) times daily as needed for moderate pain or severe pain.   Yes Historical Provider, MD  lactulose (CHRONULAC) 10 GM/15ML solution Take 45 mLs (30 g total) by mouth daily. 07/01/13  Yes Renae Fickle, MD  LORazepam (ATIVAN) 1 MG tablet Take 1 mg by mouth 3 (three) times daily.   Yes  Historical Provider, MD  Multiple Vitamin (MULTIVITAMIN) tablet Take 1 tablet by mouth daily.   Yes Historical Provider, MD  Pancrelipase, Lip-Prot-Amyl, (ZENPEP) 20000 UNITS CPEP Take 1 capsule by mouth every morning.    Yes Historical Provider, MD  potassium chloride SA (K-DUR,KLOR-CON) 20 MEQ tablet Take 2 tablets (40 mEq total) by mouth daily. 07/01/13  Yes Renae Fickle, MD  promethazine (PHENERGAN) 25 MG tablet Take 25 mg by mouth every 8 (eight) hours as needed for nausea.   Yes Historical Provider, MD  propranolol (INDERAL) 10 MG tablet Take 1 tablet (10 mg total) by mouth 2 (two) times daily. 07/01/13  Yes Renae Fickle, MD  RABEprazole (ACIPHEX) 20 MG tablet Take 20 mg by mouth daily.   Yes Historical Provider, MD  rifaximin (XIFAXAN) 550 MG TABS tablet Take 1 tablet (550 mg total) by mouth 2 (two) times daily. 07/01/13  Yes Renae Fickle, MD  sertraline (ZOLOFT) 100 MG tablet Take 100 mg by mouth daily.   Yes Historical Provider, MD  Simethicone (GAS-X EXTRA STRENGTH PO) Take 1 tablet by mouth daily as needed (For gas pain.).    Yes Historical Provider, MD  spironolactone (ALDACTONE) 50 MG tablet Take 50 mg by mouth daily.    Yes Historical Provider, MD  thiamine 100 MG tablet Take 1 tablet (100 mg total) by mouth daily. 07/01/13  Yes Renae Fickle, MD  tamsulosin (FLOMAX) 0.4 MG CAPS capsule Take 1 capsule (0.4 mg total) by mouth daily after supper. 07/01/13   Renae Fickle, MD   Physical Exam: Filed Vitals:   08/31/13 1415  BP: 109/52  Pulse: 102  Temp:   Resp: 15    BP 109/52  Pulse 102  Temp(Src) 96.6 F (35.9 C) (Rectal)  Resp 15  Ht 6' (1.829 m)  Wt 68.493 kg (151 lb)  BMI 20.47 kg/m2  SpO2 88%  General:  Appears calm and comfortable, in NAD Eyes: PERRL, normal lids, irises & conjunctiva ENT: grossly normal hearing, Dry mucous membranes Neck: no LAD, masses or thyromegaly Cardiovascular: RRR, no m/r/g. No LE edema. Telemetry: Sinus  tachycardia Respiratory: Rhales diffusely, breath sounds BL, inspiratory mild wheeze Abdomen: soft, distended, NT Skin: no rash or induration seen on limited exam Musculoskeletal: grossly normal tone BUE/BLE Psychiatric: grossly normal mood and affect, speech fluent and appropriate Neurologic: answers questions appropriately and moves all extremities equally          Labs on Admission:  Basic Metabolic Panel:  Recent Labs Lab 08/31/13 1220  NA 140  K 5.0  CL 98  CO2 14*  GLUCOSE 146*  BUN 27*  CREATININE 1.98*  CALCIUM 8.2*   Liver Function Tests:  Recent Labs Lab 08/31/13 1220  AST 217*  ALT 62*  ALKPHOS 212*  BILITOT 2.3*  PROT 6.4  ALBUMIN 2.3*    Recent Labs Lab 08/31/13 1220  LIPASE 14    Recent Labs Lab 08/31/13 1210  AMMONIA 190*   CBC:  Recent Labs Lab 08/31/13 1220  WBC 25.2*  NEUTROABS 21.4*  HGB 11.9*  HCT 37.1*  MCV 101.4*  PLT 314   Cardiac Enzymes:  Recent Labs Lab 08/31/13 1220  TROPONINI 0.74*    BNP (last 3 results)  Recent Labs  08/31/13 1220  PROBNP 17349.0*   CBG:  Recent Labs Lab 08/31/13 1122  GLUCAP 84    Radiological Exams on Admission: Dg Chest Portable 1 View  08/31/2013   CLINICAL DATA:  Abdominal pain and shortness of breath.  EXAM: PORTABLE CHEST - 1 VIEW  COMPARISON:  Chest x-ray 08/26/2003.  FINDINGS: New airspace disease throughout the entire left lung, and much of the right lung, with relative sparing of the lateral right base. Right hilar soft tissue prominence again noted. No definite pleural effusions. Heart size is normal.  IMPRESSION: 1. Interval development of widespread multifocal airspace disease asymmetrically distributed throughout the lungs bilaterally, concerning for multilobar pneumonia. 2. Persistent right hilar prominence may reflect lymphadenopathy or underlying mass. Attention on followup radiographs is recommended to ensure complete resolution of this finding.   Electronically  Signed   By: Trudie Reed M.D.   On: 08/31/2013 13:27    EKG: Independently reviewed. Sinus arrhythmia  Assessment/Plan Principal problem: Sepsis - Admit to step down unit - Most likely secondary to multifocal pneumonia reported on portable chest x-ray - Will place on broad spectrum antibiotic - Follow up of blood cultures  Elevated troponin -Cardiology on board -May be secondary to demand ischemia. Patient denies any chest pain. We'll defer further recommendations to them  Pneumonia - Given history of aspiration pneumonia we'll place on vancomycin Zosyn pharmacy to dose - Supplemental oxygen as needed - Sputum cultures - Urine Legionella and strep antigens - Blood cultures - Supportive therapy and place on Xopenex  Active Problems:   Ascites/Liver disease/Hepatic encephalopathy - Gastroenterologist consulted. we will follow up with their recommendations. Given that patient seems volume depleted with dry mucous membranes and history of poor oral intake we'll hold Lasix for today and plan on gentle IV fluid hydration.  - Otherwise continue home regimen for hepatic encephalopathy   Elevated serum creatinine - I am suspecting prerenal causes given history of poor oral intake and dry mucous membranes. - Will consider further workup should serum creatinine not improve after gentle fluid hydration.  Code Status: Pt is DNI, is ok with chest compressions Family Communication: Discussed with significant other at bedside. Disposition Plan: To stepdown for further evaluation and recommendations.  Time spent: > 70 minutes of critical care time  Penny Pia [facility] Pager [phone]  Addendum after clarification with patient patient would like full DO NOT RESUSCITATE and does not want chest compressions intubation central line

## 2013-08-31 NOTE — ED Notes (Signed)
Pt presents with lethargy. Pt HX of cirrhosis of the liver. Stomach pain with lack of appetite x 1 week. CBG 49 D50 in route IV. Distended stomach soft. Pt pale and weak.

## 2013-08-31 NOTE — Progress Notes (Signed)
ANTIBIOTIC CONSULT NOTE - INITIAL  Pharmacy Consult for Vancomycin, renally adjust antibiotics Indication: rule out pneumonia  Allergies  Allergen Reactions  . Morphine And Related Nausea Only    Patient Measurements:   Adjusted Body Weight:   Vital Signs: Temp: 96.6 F (35.9 C) (01/21 1316) Temp src: Rectal (01/21 1316) BP: 123/55 mmHg (01/21 1125) Pulse Rate: 54 (01/21 1125) Intake/Output from previous day:   Intake/Output from this shift: Total I/O In: -  Out: 100 [Urine:100]  Labs:  Recent Labs  08/31/13 1220  WBC 25.2*  HGB 11.9*  PLT 314  CREATININE 1.98*   The CrCl is unknown because both a height and weight (above a minimum accepted value) are required for this calculation. No results found for this basename: VANCOTROUGH, VANCOPEAK, VANCORANDOM, GENTTROUGH, GENTPEAK, GENTRANDOM, TOBRATROUGH, TOBRAPEAK, TOBRARND, AMIKACINPEAK, AMIKACINTROU, AMIKACIN,  in the last 72 hours   Microbiology: No results found for this or any previous visit (from the past 720 hour(s)).  Medical History: Past Medical History  Diagnosis Date  . Thrombocytopenia, unspecified 04/25/2013  . Alcoholism   . Liver cirrhosis   . Pancreatitis   . Hernia    Assessment: [age] yoM with PMHx pertinent for liver cirrhosis, alcoholism, ascites, esophageal varices, thrombocytopenia, and pancreatitis [facility], hypoglycemia, and AMS.  Pharmacy consulted to dose vancomycin for HCAP.    D1 Antibiotics 1/21 >> Vancomycin >> 1/21 >> Cefepime >>  Tm24h: 96.6 LOW WBC: Elevated, 25.2 Renal: ARF -SCr. 1.98, CrCl 45 (N44) - using ht/wt from 06/2013  No antibiotic allergies noted  Microbiology: None   Goal of Therapy:  Vancomycin trough level 15-20 mcg/ml Eradication of infection  Plan:  1.  Vancomycin 1500mg  IV x 1, then 750mg  IV q 12 hours 2.  F/u renal fxn, clinical course  Haynes Hoehnolleen Cardell Rachel, PharmD, BCPS 08/31/2013, 1:45 PM  Pager: [phone]

## 2013-08-31 NOTE — Progress Notes (Signed)
Pt taken off CAT because of desat. Pt placed back on NRB and treatment is given with NRB. Pt sat is 96%. MD aware.

## 2013-08-31 NOTE — Progress Notes (Signed)
Patient to the floor from the emergency room.  Oxygen levels in the low 60s.  RT on floor discussed patient and patient needs including ? bipap and iv lasix.  Patient is a no code and does not wish to have bi pap at this time.  Daphane ShepherdM Lynch NP paged with regards to patient orders and needs and current condition.  Patient discussed with Dr Loreta AveMann before patient arrival to unit an was very clear with this nurse that the patient was to be made comfortable.  Patient given IV Lasix and IV Diluadid at 2220.  Resting more comfortably but oxygen levels continue to be around 84% on 100% non rebreather mask

## 2013-09-01 DIAGNOSIS — R0609 Other forms of dyspnea: Secondary | ICD-10-CM

## 2013-09-01 DIAGNOSIS — R0989 Other specified symptoms and signs involving the circulatory and respiratory systems: Secondary | ICD-10-CM

## 2013-09-01 MED ORDER — SODIUM CHLORIDE 0.9 % IV SOLN
0.5000 mg/h | INTRAVENOUS | Status: DC
  Administered 2013-09-01: 0.25 mg/h via INTRAVENOUS
  Filled 2013-09-01: qty 5

## 2013-09-01 MED ORDER — LORAZEPAM 2 MG/ML IJ SOLN
1.0000 mg | Freq: Four times a day (QID) | INTRAMUSCULAR | Status: DC
  Administered 2013-09-01: 1 mg via INTRAVENOUS
  Filled 2013-09-01: qty 1

## 2013-09-01 MED ORDER — HYDROMORPHONE BOLUS VIA INFUSION
1.0000 mg | INTRAVENOUS | Status: DC | PRN
  Administered 2013-09-02: 1 mg via INTRAVENOUS
  Filled 2013-09-01: qty 1

## 2013-09-01 MED ORDER — LORAZEPAM 2 MG/ML IJ SOLN
0.5000 mg | Freq: Once | INTRAMUSCULAR | Status: AC
  Administered 2013-09-01: 0.5 mg via INTRAVENOUS
  Filled 2013-09-01: qty 1

## 2013-09-01 MED ORDER — SODIUM CHLORIDE 0.9 % IV SOLN
INTRAVENOUS | Status: DC
  Administered 2013-09-01: 02:00:00 via INTRAVENOUS

## 2013-09-01 MED ORDER — HYDROMORPHONE BOLUS VIA INFUSION
1.0000 mg | INTRAVENOUS | Status: DC | PRN
  Administered 2013-09-01 (×2): 1 mg via INTRAVENOUS
  Filled 2013-09-01: qty 1

## 2013-09-01 MED ORDER — LIP MEDEX EX OINT
TOPICAL_OINTMENT | CUTANEOUS | Status: AC
  Administered 2013-09-01: 23:00:00
  Filled 2013-09-01: qty 7

## 2013-09-01 MED ORDER — LORAZEPAM 2 MG/ML IJ SOLN
1.0000 mg | INTRAMUSCULAR | Status: DC
  Administered 2013-09-02: 1 mg via INTRAVENOUS
  Filled 2013-09-01 (×2): qty 1

## 2013-09-01 MED ORDER — MAGIC MOUTHWASH
5.0000 mL | Freq: Three times a day (TID) | ORAL | Status: DC
  Administered 2013-09-01: 5 mL via ORAL
  Filled 2013-09-01 (×4): qty 5

## 2013-09-01 NOTE — Consult Note (Signed)
Patient Alexander Burch      DOB: [birth year]      WTU:[id number]   Summary of Goals of care; full note to follow:  Met with patient's mother whom he has verbal indicated should be his decision maker when he is unable as reported by his mother and notes from ED.  I have also spoken with his two children who recognize their grandmother as Media planner and are in agreement with the current plan of care.   Alexander Burch has limited ability to participate in these goals of care due to confusion.  We did attempt to elicit some responses and update him but he was unable to fully grasp the situation.  Alexander Burch relates that she knows that Alexander Burch is dying .  She understands his liver problems and now the pneumonia, she assertst that Alexander Burch has requested comfort care and we are going to honor this.  He made himself a DNR.  He has had limited response to as needed symptom management and at this time is necessary to use continuous opiates and scheduled benzodiazepines to promote comfort and dignity.  All parties understand the plan of care and I spoke with Dr. Wendee Beavers as well  Recommend:  1.  DNR  2.  Pain: order air mattress overlay, start dilaudid drip 0.25 mg/hr with prn boluses .  Up to this time he has used 6 mg of dilaudid for the day.   3. Dyspnea: dilaudid infusion as per above  4.  Delirium and agitation: schedule ativan 1 mg q 6 hrs  5.  Mucocitis: paint mouth with magic mouthwash and do mouth care q 2hrs  6.  Pneumonia:  Full comfort . Stop antibiotics.

## 2013-09-01 NOTE — Progress Notes (Signed)
Subjective: Since I last evaluated the patient, the patient seems to resting comfortable. His mother is at his bedside and is waiting for the palliative care MD to discuss a game plan with her.   Objective: Vital signs in last 24 hours: Temp:  [96.6 F (35.9 C)-97.7 F (36.5 C)] 97.7 F (36.5 C) (01/22 0630) Pulse Rate:  [54-109] 91 (01/22 0630) Resp:  [15-39] 22 (01/22 0630) BP: (103-123)/(52-64) 103/64 mmHg (01/22 0630) SpO2:  [54 %-96 %] 87 % (01/22 0630) Weight:  [68.493 kg (151 lb)] 68.493 kg (151 lb) (01/21 1424)    Intake/Output from previous day: 01/21 0701 - 01/22 0700 In: 325 [P.O.:30; I.V.:95; IV Piggyback:200] Out: 300 [Urine:300] Intake/Output this shift:   General appearance: appears older than stated age, fatigued, icteric, moderate distress, pale and slowed mentation; face mask on Resp: diminished breath sounds bilaterally Cardio: regular rate and rhythm, S1, S2 normal, no murmur, click, rub or gallop GI: soft, distended with fluid shift and a large reducible umbilical hernia; bowel sounds hypoactive  Extremities: extremities normal, atraumatic, no cyanosis or edema  Lab Results:  Recent Labs  08/31/13 1220  WBC 25.2*  HGB 11.9*  HCT 37.1*  PLT 314   BMET  Recent Labs  08/31/13 1220  NA 140  K 5.0  CL 98  CO2 14*  GLUCOSE 146*  BUN 27*  CREATININE 1.98*  CALCIUM 8.2*   LFT  Recent Labs  08/31/13 1220  PROT 6.4  ALBUMIN 2.3*  AST 217*  ALT 62*  ALKPHOS 212*  BILITOT 2.3*   PT/INR  Recent Labs  08/31/13 1220  LABPROT 40.9*  INR 4.49*  Studies/Results: Dg Chest Portable 1 View  08/31/2013   CLINICAL DATA:  Abdominal pain and shortness of breath.  EXAM: PORTABLE CHEST - 1 VIEW  COMPARISON:  Chest x-ray 08/26/2003.  FINDINGS: New airspace disease throughout the entire left lung, and much of the right lung, with relative sparing of the lateral right base. Right hilar soft tissue prominence again noted. No definite pleural effusions.  Heart size is normal.  IMPRESSION: 1. Interval development of widespread multifocal airspace disease asymmetrically distributed throughout the lungs bilaterally, concerning for multilobar pneumonia. 2. Persistent right hilar prominence may reflect lymphadenopathy or underlying mass. Attention on followup radiographs is recommended to ensure complete resolution of this finding.   Electronically Signed   By: Trudie Reedaniel  Entrikin M.D.   On: 08/31/2013 13:27   Medications: I have reviewed the patient's current medications.  Assessment/Plan: ESLD with ascites and multifocal pneumonia elevated troponins: now receiving comfort care only. I  Will sign off now but continue to visit as I have known the family for a long time.   LOS: 1 day   Jermika Olden 09/01/2013, 7:44 AM

## 2013-09-01 NOTE — Progress Notes (Signed)
[facility] PROGRESS NOTE  Alexander Burch JXB:[id number] DOB: 09/12/1960 DOA: 08/31/2013 PCP: Warrick Parisian, MD  Assessment/Plan:  Aspiration pneumonia/Sepsis - At this point will plan on continuing Zosyn.  Vancomycin ordered initially - supplemental oxygen prn  Active Problems:   Ascites/Liver disease/Hepatic encephalopathy - Gastroenterology on board initially. - continue current regimen until goals of care defined.    Elevated troponin - Most likely 2ary to demand ischemia 2ary to principal problem.   Code Status: DNR comfort care Family Communication: Discussed yesterday with mother at bedside. Disposition Plan: Comfort care measures   Consultants:  Palliative  GI  Procedures:  none  Antibiotics:  Zosyn  HPI/Subjective: Pt somnolent.  But does respond to questions. Although quite limited responses. Has no new complaints for me.  Objective: Filed Vitals:   09/01/13 1420  BP: 100/62  Pulse: 95  Temp: 97.5 F (36.4 C)  Resp: 24    Intake/Output Summary (Last 24 hours) at 09/01/13 1520 Last data filed at 09/01/13 1400  Gross per 24 hour  Intake    325 ml  Output    200 ml  Net    125 ml   Filed Weights   08/31/13 1424  Weight: 68.493 kg (151 lb)    Exam:   General:  Pt somnolent, arrousable on command  Cardiovascular: RRR, no rubs  Respiratory: increased rate, rhales diffusely, no wheezes  Abdomen: soft, distended, NT, periumbilical hernia easily reducible  Musculoskeletal: no cyanosis or clubbing  Data Reviewed: Basic Metabolic Panel:  Recent Labs Lab 08/31/13 1220  NA 140  K 5.0  CL 98  CO2 14*  GLUCOSE 146*  BUN 27*  CREATININE 1.98*  CALCIUM 8.2*   Liver Function Tests:  Recent Labs Lab 08/31/13 1220  AST 217*  ALT 62*  ALKPHOS 212*  BILITOT 2.3*  PROT 6.4  ALBUMIN 2.3*    Recent Labs Lab 08/31/13 1220  LIPASE 14    Recent Labs Lab 08/31/13 1210  AMMONIA 190*   CBC:  Recent Labs Lab  08/31/13 1220  WBC 25.2*  NEUTROABS 21.4*  HGB 11.9*  HCT 37.1*  MCV 101.4*  PLT 314   Cardiac Enzymes:  Recent Labs Lab 08/31/13 1220  TROPONINI 0.74*   BNP (last 3 results)  Recent Labs  08/31/13 1220  PROBNP 17349.0*   CBG:  Recent Labs Lab 08/31/13 1122  GLUCAP 84    No results found for this or any previous visit (from the past 240 hour(s)).   Studies: Dg Chest Portable 1 View  08/31/2013   CLINICAL DATA:  Abdominal pain and shortness of breath.  EXAM: PORTABLE CHEST - 1 VIEW  COMPARISON:  Chest x-ray 08/26/2003.  FINDINGS: New airspace disease throughout the entire left lung, and much of the right lung, with relative sparing of the lateral right base. Right hilar soft tissue prominence again noted. No definite pleural effusions. Heart size is normal.  IMPRESSION: 1. Interval development of widespread multifocal airspace disease asymmetrically distributed throughout the lungs bilaterally, concerning for multilobar pneumonia. 2. Persistent right hilar prominence may reflect lymphadenopathy or underlying mass. Attention on followup radiographs is recommended to ensure complete resolution of this finding.   Electronically Signed   By: Trudie Reed M.D.   On: 08/31/2013 13:27    Scheduled Meds: . folic acid  1 mg Oral Daily  . furosemide  20 mg Oral BID  . levalbuterol  1.25 mg Nebulization Q6H  . piperacillin-tazobactam (ZOSYN)  IV  3.375 g Intravenous Q8H  . spironolactone  50 mg Oral Daily   Continuous Infusions: . sodium chloride 20 mL/hr at 09/01/13 0130    Time spent: > 35 minutes    Penny PiaVEGA, Canna Nickelson  [facility] Pager [phone] If 7PM-7AM, please contact night-coverage at [email or web address], password [facility] 09/01/2013, 3:20 PM  LOS: 1 day

## 2013-09-01 NOTE — Progress Notes (Addendum)
    Subjective:  Denies dyspnea; chest pain with inspiration   Objective:  Filed Vitals:   08/31/13 2202 08/31/13 2224 08/31/13 2357 09/01/13 0630  BP:    103/64  Pulse:   100 91  Temp:    97.7 F (36.5 C)  TempSrc:    Axillary  Resp:   22 22  Height:      Weight:      SpO2: 77% 82% 82% 87%    Intake/Output from previous day:  Intake/Output Summary (Last 24 hours) at 09/01/13 0813 Last data filed at 09/01/13 0615  Gross per 24 hour  Intake    325 ml  Output    300 ml  Net     25 ml    Physical Exam: Physical exam: Well-developed chronically ill appearing; on nonrebreather Skin is warm and dry.  HEENT is normal.  Neck is supple.  Chest with basilar crackles Cardiovascular exam is tachycardic Abdominal exam distended, positive ascites Extremities show no edema.     Lab Results: Basic Metabolic Panel:  Recent Labs  19/14/7801/21/15 1220  NA 140  K 5.0  CL 98  CO2 14*  GLUCOSE 146*  BUN 27*  CREATININE 1.98*  CALCIUM 8.2*   CBC:  Recent Labs  08/31/13 1220  WBC 25.2*  NEUTROABS 21.4*  HGB 11.9*  HCT 37.1*  MCV 101.4*  PLT 314   Cardiac Enzymes:  Recent Labs  08/31/13 1220  TROPONINI 0.74*     Assessment/Plan:  1 Elevated troponin-patient has requested palliative care and is not a candidate for aggressive cardiac eval. Will cancel echo 2 Cirrhosis - end stage; palliative care 3 Pneumonia/sepsis-per primary care We will sign off; please call with questions. Olga MillersBrian Crenshaw 09/01/2013, 8:13 AM

## 2013-09-01 NOTE — Consult Note (Signed)
Patient ZO:XWRUEA L Burch      DOB: [birth year]      VWU:[id number]     Consult Note from the Palliative Medicine Team at [facility]    Consult Requested by: Dr. Cena Burch     PCP: Alexander Parisian, MD Reason for Consultation: GOC    Phone Number:[phone]  Assessment of patients Current state: 53 yr old with advanced alcoholic liver disease presents with worsening abdominal pain and was sent by his GI specialist.  Patient was found to have persistent widely spread pneumonia, and an elevated troponin.  He was in respiratory failure with periods of agitation.  I confirmed goal with his mother and updated his children. Please see my note on date of service. Family desires to honor patient's wish for do not resuscitate and wants to move on to full comfort  Care.   Goals of Care: 1.  Code Status: DNR   2. Scope of Treatment: DC antibiotics and start a low dose morphine drip with scheduled benzodiazipines.   4. Disposition: Expecting a hospital death   3. Symptom Management:   1. Anxiety/Agitation: schedule atiavan 2. Pain:/dyspnea: start dilaudid drip with boluses prn 3. Delirium: ativan and can use haldol if needed prn 4. Terminal Secretions: atropine and scopalomine  4. Psychosocial: two children, loving son but not able to kick alcohol  5. Spiritual:  Chaplains offered for mother.        Patient Documents Completed or Given: Document Given Completed  Advanced Directives Pkt    MOST    DNR    Gone from My Sight    Hard Choices      Brief HPI: 53 yr old who had been being treated for pneumonia returned to hospital with worsening respiratory distress and abdomen pain.  Found to have elevated troponin, respiratory failure from severe multilobar pneumonia.  We were asked to assist with goc, and related symtom management   ROS: unable to obtain due to altered mental status.    PMH:  Past Medical History  Diagnosis Date  . Thrombocytopenia, unspecified 04/25/2013   a. Followed by Dr. 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   . Alcoholism   . Liver cirrhosis   . Chronic pancreatitis   . Hernia   . Ascites     a. s/p multiple paracenteses.  . Esophageal varices     a. on propranolol, spironolactone.  . Normocytic anemia   . Hyponatremia   . Hepatic encephalopathy     a. 06/2013, mildly elevated ammonia.  . Aspiration pneumonia     a. 06/2013. Abnormal CT with ? infxn vs mass.     PSH: Past Surgical History  Procedure Laterality Date  . Appendectomy  2001  . Small intestine abcess  2001   I have reviewed the FH and SH and  If appropriate update it with new information. Allergies  Allergen Reactions  . Morphine And Related Nausea Only   Scheduled Meds: . levalbuterol  1.25 mg Nebulization Q6H  . LORazepam  1 mg Intravenous Q6H  . magic mouthwash  5 mL Oral TID   Continuous Infusions: . sodium chloride 20 mL/hr at 09/01/13 0130  . HYDROmorphone     PRN Meds:.HYDROmorphone    BP 100/62  Pulse 95  Temp(Src) 97.5 F (36.4 C) (Axillary)  Resp 24  Ht 6' (1.829 m)  Wt 68.493 kg (151 lb)  BMI 20.47 kg/m2  SpO2 89%   PPS:10 %  Intake/Output Summary (Last 24 hours) at 09/01/13 1855 Last data filed at 09/01/13 1804  Gross per 24 hour  Intake    325 ml  Output    333 ml  Net     -8 ml    Physical Exam:  General:  Increased work of breathing with accesory musclel use HEENT:  Pupils round reactive, mm dry Chest:   Coarse bilateral rhonchi CVS: tachy, S1, S2 Abdomen:schapoid, not tender Ext: wasted, cachectic Neuro:altered mental status can't put full sentence together , does not have capacity for decision making  Labs: CBC    Component Value Date/Time   WBC 25.2* 08/31/2013 1220   WBC 8.5 04/25/2013 1534   RBC 3.66* 08/31/2013 1220   HGB 11.9* 08/31/2013 1220   HGB 12.5* 04/25/2013 1534   HCT 37.1* 08/31/2013 1220   HCT 37.3* 04/25/2013 1534   PLT 314  08/31/2013 1220   PLT 65* 04/25/2013 1534   MCV 101.4* 08/31/2013 1220   MCV 103* 04/25/2013 1534   MCH 32.5 08/31/2013 1220   MCH 34.5* 04/25/2013 1534   MCHC 32.1 08/31/2013 1220   MCHC 33.5 04/25/2013 1534   RDW 14.0 08/31/2013 1220   RDW 13.0 04/25/2013 1534   LYMPHSABS 1.5 08/31/2013 1220   LYMPHSABS 0.9 04/25/2013 1534   MONOABS 2.3* 08/31/2013 1220   EOSABS 0.0 08/31/2013 1220   EOSABS 0.2 04/25/2013 1534   BASOSABS 0.0 08/31/2013 1220   BASOSABS 0.0 04/25/2013 1534    BMET   CMP     Component Value Date/Time   NA 140 08/31/2013 1220   K 5.0 08/31/2013 1220   CL 98 08/31/2013 1220   CO2 14* 08/31/2013 1220   GLUCOSE 146* 08/31/2013 1220   BUN 27* 08/31/2013 1220   CREATININE 1.98* 08/31/2013 1220   CALCIUM 8.2* 08/31/2013 1220   PROT 6.4 08/31/2013 1220   ALBUMIN 2.3* 08/31/2013 1220   AST 217* 08/31/2013 1220   ALT 62* 08/31/2013 1220   ALKPHOS 212* 08/31/2013 1220   BILITOT 2.3* 08/31/2013 1220   GFRNONAA 37* 08/31/2013 1220   GFRAA 43* 08/31/2013 1220    Chest Xray Reviewed/Impressions:  1. Interval development of widespread multifocal airspace disease  asymmetrically distributed throughout the lungs bilaterally,  concerning for multilobar pneumonia.  2. Persistent right hilar prominence may reflect lymphadenopathy or  underlying mass. Attention on followup radiographs is recommended to  ensure complete resolution of this finding.      Time In Time Out Total Time Spent with Patient Total Overall Time    45 min    See my note from date of service Greater than 50%  of this time was spent counseling and coordinating care related to the above assessment and plan.  Alexander Price L. Ladona Ridgelaylor, MD MBA The Palliative Medicine Team at [facility] Team Phone: [phone] Pager: [phone]

## 2013-09-01 NOTE — Progress Notes (Signed)
Discussed patient with mother who is at bedside.  Patient continues to wear a NRB mask at 100 % oxygen saturation remains in the lower 80's patients respirations are less labored and are around 20.  Patient report no shortness of breath or discomfort at this time.  Mother states "he has been resting".  Mom aware patient with lower oxygen levels but is pleased he is more comfortable.  Blanket pillows and drinks provided along with snacks to mother.  Encouraged her to ask questions

## 2013-09-01 NOTE — Progress Notes (Signed)
Alexander McgregorMary Lynch NP notified that patient has had no output, still on NRB mask at 100% oxygen with oxygen levels in mid 80's.  Information acknowledged no change in treatment at this time as patient and mother wish patient to have comfort care at this time.  Discussed current orders to call physician within current parameters,  NP to change orders.

## 2013-09-01 NOTE — Progress Notes (Signed)
Patient ZO:XWRUEA:Alexander Burch      DOB: 02/25/1961      VWU:[id number]  Spoke with nursing regarding symptom management after starting dilaudid infusion. Patient still restless, moaning with paradoxical breathing. Will increase basilar infusion to to 0.5 mg/hr and decrease interval for scheduled ativan to q 4 hours. Nursing may bolus q 30 min if needed for pain or dyspnea.   Jandel Patriarca L. Ladona Ridgelaylor, MD MBA The Palliative Medicine Team at [facility] Team Phone: [phone] Pager: [phone]

## 2013-09-01 NOTE — Progress Notes (Signed)
Thank you for consulting the Pall[facility] Medicine Team at [facility] to meet your patient's and family's needs.   The reason that you asked us to see your patient is  For GOc  We have scheduled your patient for a meeting: Today 1/22 2 pm  The Surrogate decision make is: Mother, Thomasenia BottomsDaisy Stalling  Contact [phone]  Other family members that need to be present: None    Your patient is able/unable to participate: able   Additional Narrative:   Kourosh Jablonsky L. Ladona Ridgelaylor, MD MBA The Pall[facility] Medicine Team at [facility] Team Phone: [phone] Pager: [phone]

## 2013-09-01 NOTE — Clinical Documentation Improvement (Signed)
Possible Clinical conditions  Underweight w/BMI=20.4  Other condition___________________  Cannot clinically determine _____________  Risk Factors:Alcoholism, Liver cirrhosis, Pancreatitis,  Sign & Symptoms:08/31/13:Gastrointestinal: +N/V, poor PO intake, Thin, frail  Treatment: Multiple Vitamin    Thank You, Andy GaussGarnet C Elly Haffey ,RN Clinical Documentation Specialist:  [phone]  [facility]- Health Information Management

## 2013-09-11 NOTE — Progress Notes (Signed)
Patient found with no respirations, blood pressure, or pulse.  Confirmed by two RN's.  Dawn Storm FriskWilliams Schadt and Pernell DupreLaurie Epperson RN.  Mother at bedside, Attending notified see post mortem flow sheet.

## 2013-09-11 NOTE — Progress Notes (Signed)
Wasted 72 cc of dilaudid gtt. Witnessed by Pernell DupreLaurie Epperson RN

## 2013-09-11 NOTE — Progress Notes (Signed)
Chaplain paged at [phone] and arrived at bedside at [id number].  Mr Alexander Burch had died prior to the chaplain's arrival. His mother was at his bedside. She is the only relative in the area and has been his primary care giver in recent times. She relates that Mr. Alexander Burch as children who live a distance away, and siblings that live a distance away. These will be called after she has gained enough emotional strength to begin the process of calling. Because the family is so scattered and it will take a day to gather consensus, nurses have given the number of Bed Control to call when funeral home arrangements have been made. Mr Alexander Burch will be cremated.   Mr Alexander Burch was a person of [place]. He was known as a deeply spiritual person, meditating daily and singing songs of faith to comfort himself and others.   Mr Alexander Burch did not have a community of faith. His mother's community of faith will attend to her spiritual needs.  Chaplain provided grief care, and provided prayers of comfort and assurance.  No further Spiritual Care visits are anticipated.  Alexander Burch, DMin Chaplain

## 2013-09-11 DEATH — deceased

## 2013-09-15 NOTE — Discharge Summary (Signed)
Death Summary  Alexander Snellndrew L Harwick [id number] DOB: 07/18/1961 DOA: 08/31/2013  PCP: Warrick ParisianSTALLINGS,SHEILA, MD  Admit date: 08/31/2013 Date of Death: 09/15/2013  Final Diagnoses:  Active Problems:   Ascites   Liver disease   Hepatic encephalopathy   Aspiration pneumonia   Sepsis   Elevated troponin   Palliative care encounter   History of present illness:  Alexander Burch is a 53 y.o. male  With alcoholic liver cirrhosis, history of hepatic encephalopathy, and recent admission for pneumonia thought to be secondary to aspiration pneumonia. Presenting this admission complaining of abdominal discomfort. Patient states that he presented to the ED because his gastroenterologist recommended he come for further evaluation. Patient reports that within the last 3 weeks he has received antibiotics off and on for lung infection. Patient recently admitted and treated for suspected aspiration pneumonia. His presenting today complaining of cough and some shortness of breath. Has also been complaining of abdominal discomfort which his gastroenterologist have been treating as outpatient.  While in the ED patient was found to have marked leukocytosis, low body temperature of 96.6, elevated heart rate of 102, elevated troponin of 0.74 of which ED physician consult at cardiology. We were consult at for further evaluation and recommendations.   Hospital Course:  While in house patient was hypotensive. He stated he didn't want aggressive measures and wanted to be comfort care.  Despite being on Vanc and Zosyn patient's condition deteriorated.   Time: found with no respirations at 427am 06/06/14  Signed:  Penny PiaVEGA, Jerian Morais  [facility] 09/15/2013, 5:42 PM

## 2014-07-19 IMAGING — CR DG CHEST 2V
2 series · 2 of 2 positions shown · non-contrast
Comparison: None

CLINICAL DATA: Right side lower posterior chest and back pain,
smoker, ascites, cirrhosis

EXAM:
CHEST  2 VIEW

[w chest lat]
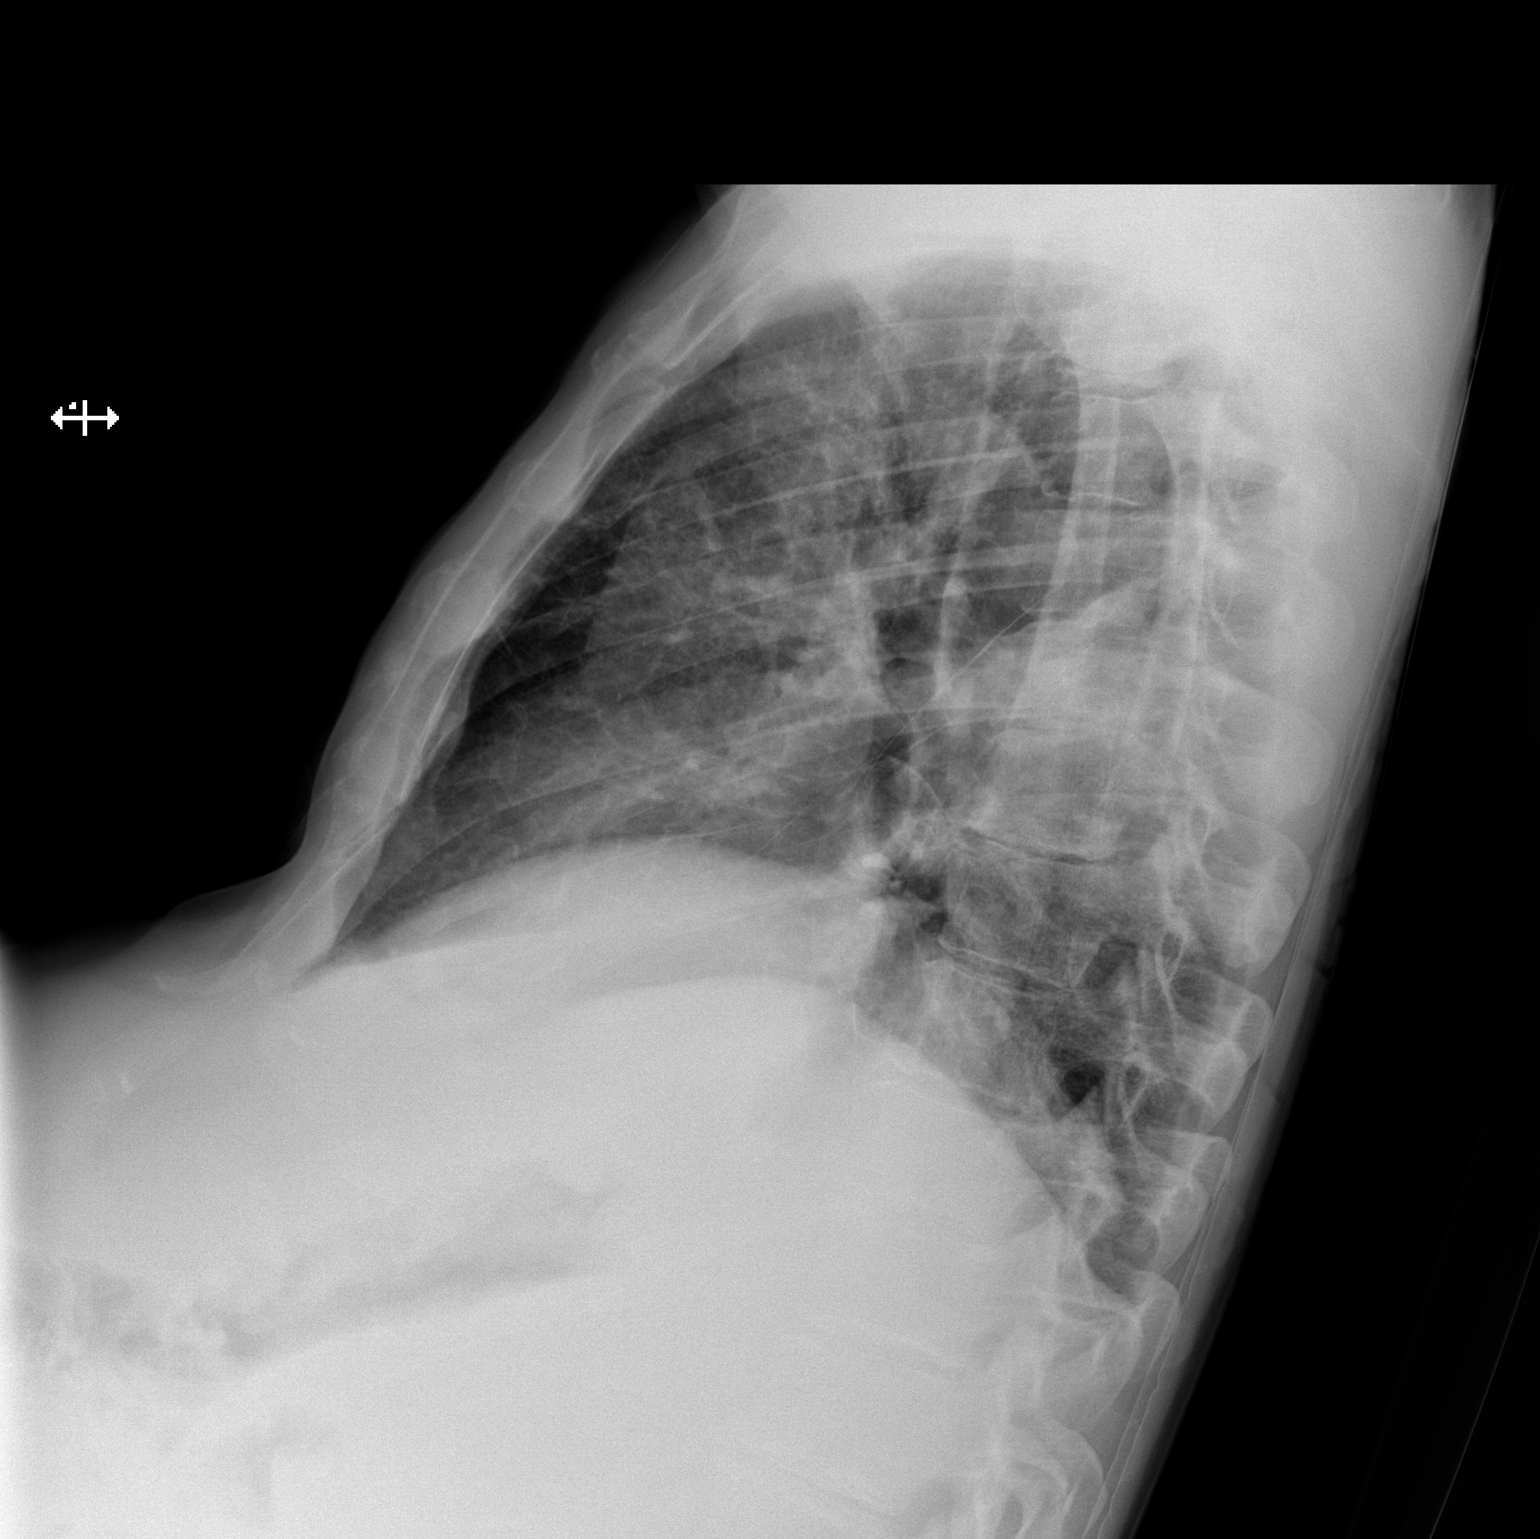

[x chest ap]
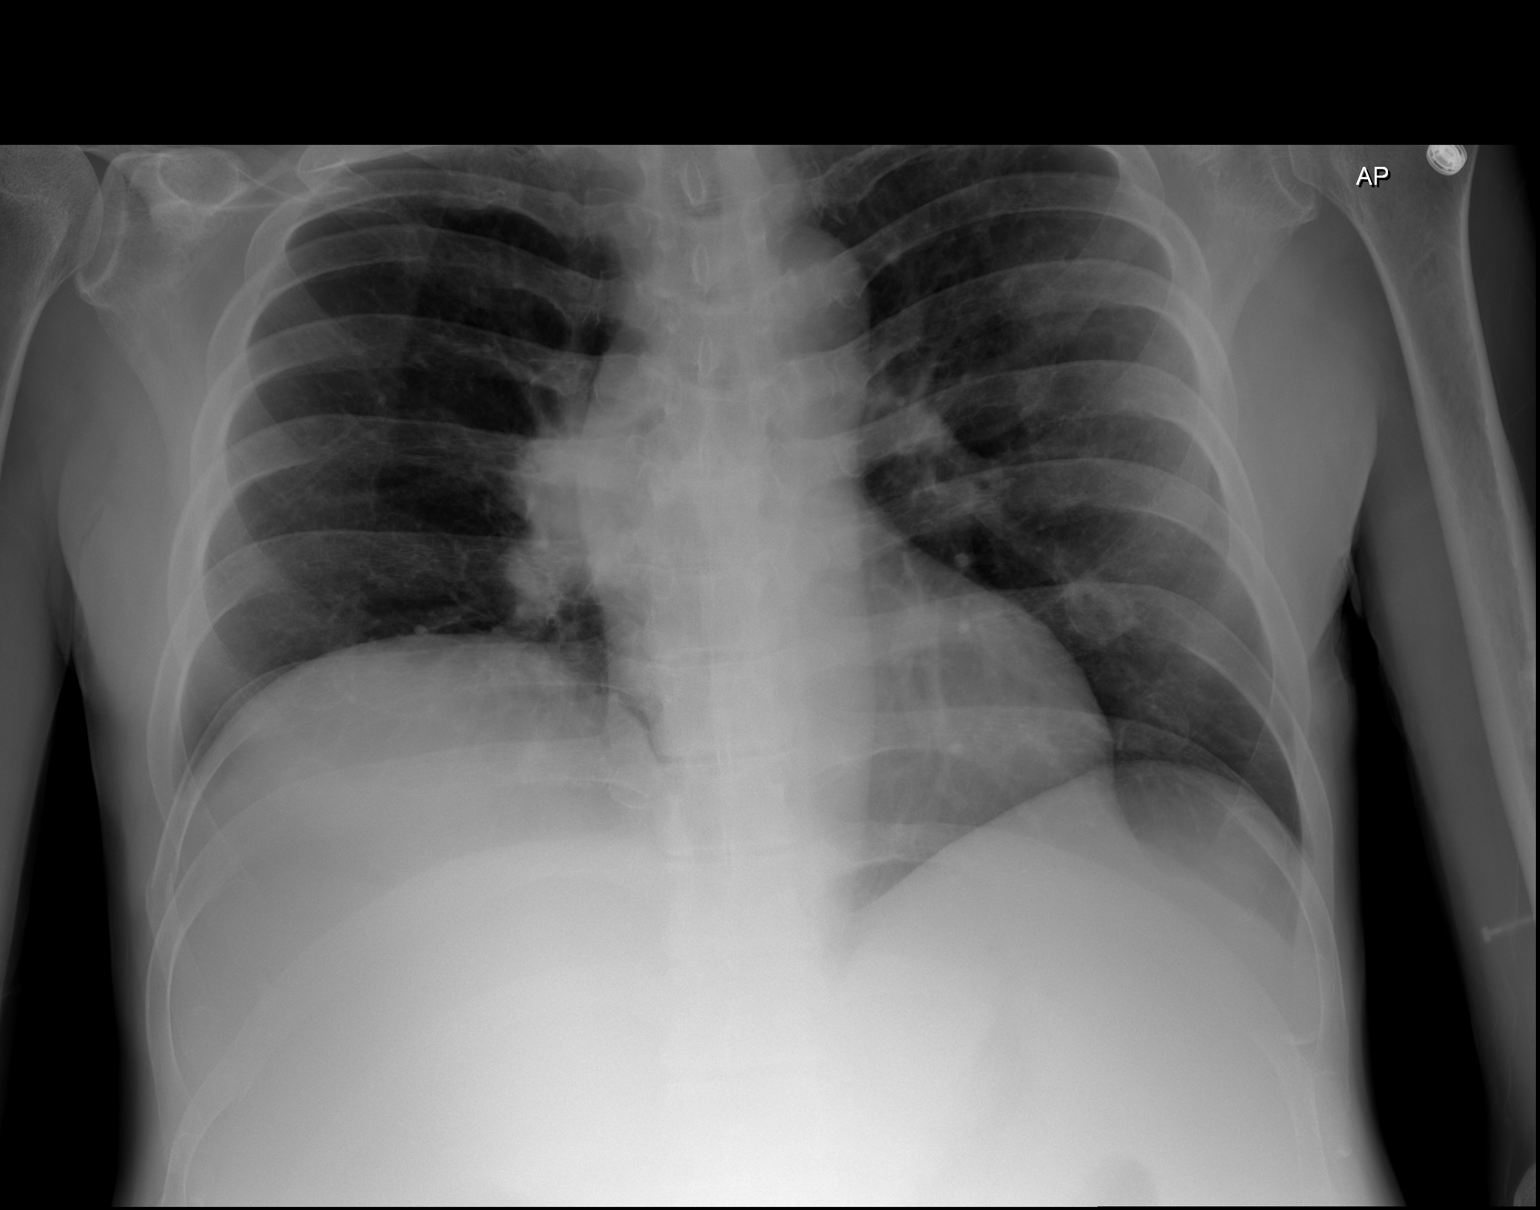

[2 of 2 positions shown; findings below may reference images not displayed]

FINDINGS: Normal heart size.

Abnormal soft tissue density at right hilum on frontal view
corresponds to density posteriorly on the lateral view likely a
retro hilar mass in the superior segment of the right lower lobe.

Remaining lungs clear.

No pleural effusion or pneumothorax.

Mild elevation of right diaphragm noted.

Fractures seen at lateral right 8th rib and posterolateral right
10th rib.
IMPRESSION: Suspected pulmonary mass in the superior segment of the right lower
lobe; CT chest with contrast recommended for further evaluation.

Fractures of the right 8th and 10th ribs as above.

Findings called to Masakiyo Kahlon on 06/28/2013 at 1142 hr.

## 2014-09-21 IMAGING — CR DG CHEST 1V PORT
1 series · 1 of 1 positions shown · non-contrast
Comparison: Chest x-ray 08/26/2003.

CLINICAL DATA: Abdominal pain and shortness of breath.

EXAM:
PORTABLE CHEST - 1 VIEW

[AP]
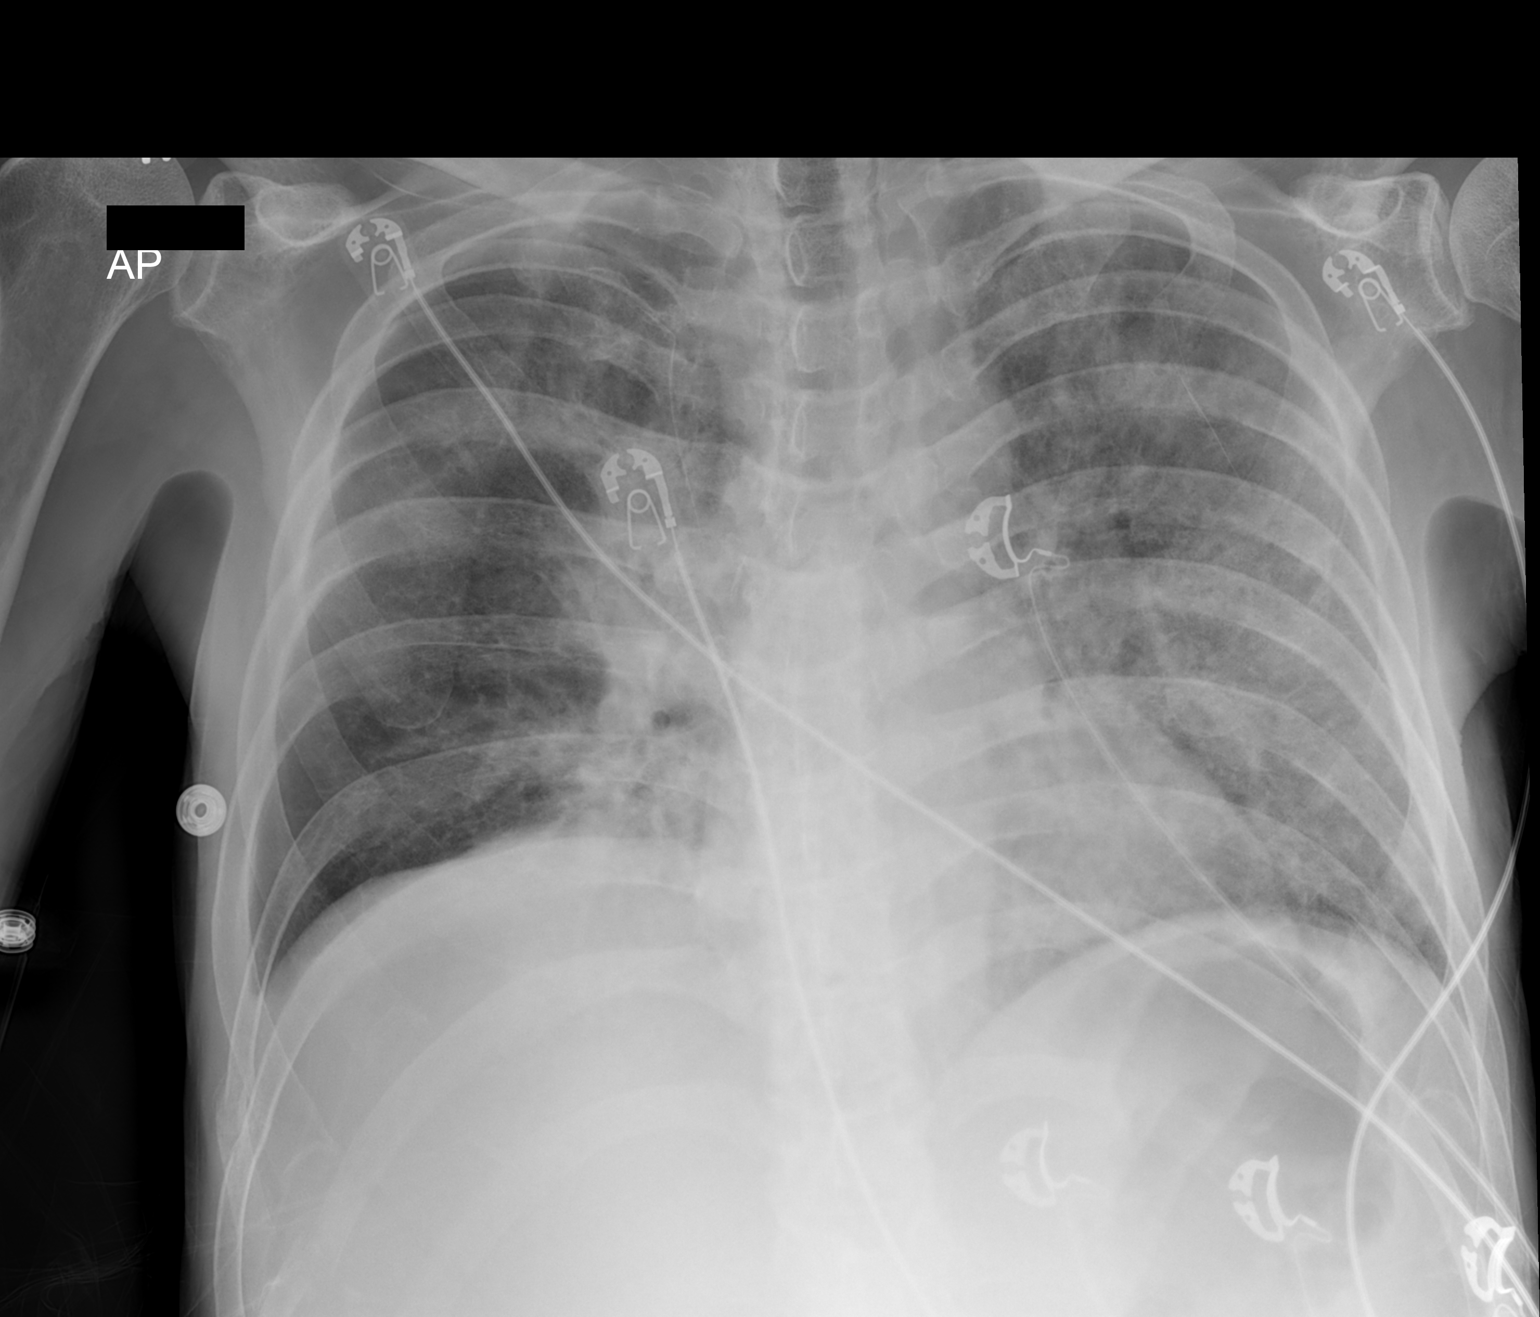

[1 of 1 positions shown; findings below may reference images not displayed]

FINDINGS: New airspace disease throughout the entire left lung, and much of
the right lung, with relative sparing of the lateral right base.
Right hilar soft tissue prominence again noted. No definite pleural
effusions. Heart size is normal.
IMPRESSION: 1. Interval development of widespread multifocal airspace disease
asymmetrically distributed throughout the lungs bilaterally,
concerning for multilobar pneumonia.
2. Persistent right hilar prominence may reflect lymphadenopathy or
underlying mass. Attention on followup radiographs is recommended to
ensure complete resolution of this finding.
